# Patient Record
Sex: Female | Born: 1975 | Race: White | Hispanic: No | Marital: Married | Smoking: Never smoker
Health system: Southern US, Community
[De-identification: ages and names within clinical notes are randomized; demographics above are authoritative.]

---

## 2001-02-25 ENCOUNTER — Encounter: Admission: RE | Admit: 2001-02-25 | Discharge: 2001-02-25 | Payer: Self-pay | Admitting: Internal Medicine

## 2001-02-25 ENCOUNTER — Encounter: Payer: Self-pay | Admitting: Internal Medicine

## 2003-05-13 ENCOUNTER — Other Ambulatory Visit: Admission: RE | Admit: 2003-05-13 | Discharge: 2003-05-13 | Payer: Self-pay | Admitting: Family Medicine

## 2004-02-17 ENCOUNTER — Other Ambulatory Visit: Admission: RE | Admit: 2004-02-17 | Discharge: 2004-02-17 | Payer: Self-pay | Admitting: Obstetrics and Gynecology

## 2005-12-06 ENCOUNTER — Other Ambulatory Visit: Admission: RE | Admit: 2005-12-06 | Discharge: 2005-12-06 | Payer: Self-pay | Admitting: Family Medicine

## 2006-09-28 ENCOUNTER — Encounter: Admission: RE | Admit: 2006-09-28 | Discharge: 2006-09-28 | Payer: Self-pay | Admitting: Family Medicine

## 2006-12-26 ENCOUNTER — Other Ambulatory Visit: Admission: RE | Admit: 2006-12-26 | Discharge: 2006-12-26 | Payer: Self-pay | Admitting: Family Medicine

## 2007-12-28 ENCOUNTER — Other Ambulatory Visit: Admission: RE | Admit: 2007-12-28 | Discharge: 2007-12-28 | Payer: Self-pay | Admitting: Family Medicine

## 2009-06-10 ENCOUNTER — Encounter: Admission: RE | Admit: 2009-06-10 | Discharge: 2009-06-10 | Payer: Self-pay | Admitting: Orthopedic Surgery

## 2010-03-02 ENCOUNTER — Inpatient Hospital Stay (HOSPITAL_COMMUNITY): Admission: AD | Admit: 2010-03-02 | Discharge: 2010-03-04 | Payer: Self-pay | Admitting: Obstetrics and Gynecology

## 2010-03-02 ENCOUNTER — Inpatient Hospital Stay (HOSPITAL_COMMUNITY): Admission: AD | Admit: 2010-03-02 | Discharge: 2010-03-02 | Payer: Self-pay | Admitting: Obstetrics & Gynecology

## 2010-04-19 HISTORY — PX: OTHER SURGICAL HISTORY: SHX169

## 2010-06-30 LAB — CBC
HCT: 29.5 % — ABNORMAL LOW (ref 36.0–46.0)
HCT: 35.2 % — ABNORMAL LOW (ref 36.0–46.0)
Hemoglobin: 10.1 g/dL — ABNORMAL LOW (ref 12.0–15.0)
Hemoglobin: 11.9 g/dL — ABNORMAL LOW (ref 12.0–15.0)
MCH: 31.4 pg (ref 26.0–34.0)
MCH: 31.6 pg (ref 26.0–34.0)
MCHC: 33.8 g/dL (ref 30.0–36.0)
MCHC: 34 g/dL (ref 30.0–36.0)
MCV: 93 fL (ref 78.0–100.0)
Platelets: 252 10*3/uL (ref 150–400)
RBC: 3.79 MIL/uL — ABNORMAL LOW (ref 3.87–5.11)
RDW: 15.7 % — ABNORMAL HIGH (ref 11.5–15.5)
RDW: 15.7 % — ABNORMAL HIGH (ref 11.5–15.5)
WBC: 19.6 10*3/uL — ABNORMAL HIGH (ref 4.0–10.5)

## 2010-06-30 LAB — RPR: RPR Ser Ql: NONREACTIVE

## 2013-01-24 LAB — OB RESULTS CONSOLE ABO/RH: RH Type: POSITIVE

## 2013-01-24 LAB — OB RESULTS CONSOLE GC/CHLAMYDIA
CHLAMYDIA, DNA PROBE: NEGATIVE
GC PROBE AMP, GENITAL: NEGATIVE

## 2013-01-24 LAB — OB RESULTS CONSOLE HEPATITIS B SURFACE ANTIGEN: Hepatitis B Surface Ag: NEGATIVE

## 2013-01-24 LAB — OB RESULTS CONSOLE ANTIBODY SCREEN: ANTIBODY SCREEN: NEGATIVE

## 2013-01-24 LAB — OB RESULTS CONSOLE HIV ANTIBODY (ROUTINE TESTING): HIV: NONREACTIVE

## 2013-01-24 LAB — OB RESULTS CONSOLE RUBELLA ANTIBODY, IGM: RUBELLA: IMMUNE

## 2013-01-24 LAB — OB RESULTS CONSOLE RPR: RPR: NONREACTIVE

## 2013-04-19 NOTE — L&D Delivery Note (Signed)
Patient was C/C/+2 and pushed for <5  minutes with epidural.   NSVD  female infant, Apgars 9/9, weight pending.   The patient had a 1st degree laceration repaired with 3-0 vicryl. Fundus was firm. EBL was expected. Placenta was delivered intact. Vagina was clear.  Baby was vigorous and doing skin to skin with mother.  Kaitlin Horton

## 2013-08-01 LAB — OB RESULTS CONSOLE GBS
GBS: NEGATIVE
STREP GROUP B AG: NEGATIVE

## 2013-09-01 ENCOUNTER — Encounter (HOSPITAL_COMMUNITY): Payer: Self-pay | Admitting: *Deleted

## 2013-09-01 ENCOUNTER — Inpatient Hospital Stay (HOSPITAL_COMMUNITY): Payer: BC Managed Care – PPO | Admitting: Anesthesiology

## 2013-09-01 ENCOUNTER — Inpatient Hospital Stay (HOSPITAL_COMMUNITY)
Admission: AD | Admit: 2013-09-01 | Discharge: 2013-09-03 | DRG: 775 | Disposition: A | Discharge status: Home / Self Care | Payer: BC Managed Care – PPO | Source: Ambulatory Visit | Attending: Obstetrics and Gynecology | Admitting: Obstetrics and Gynecology

## 2013-09-01 ENCOUNTER — Encounter (HOSPITAL_COMMUNITY): Payer: BC Managed Care – PPO | Admitting: Anesthesiology

## 2013-09-01 DIAGNOSIS — O99214 Obesity complicating childbirth: Secondary | ICD-10-CM

## 2013-09-01 DIAGNOSIS — E669 Obesity, unspecified: Secondary | ICD-10-CM | POA: Diagnosis present

## 2013-09-01 DIAGNOSIS — O09529 Supervision of elderly multigravida, unspecified trimester: Secondary | ICD-10-CM | POA: Diagnosis present

## 2013-09-01 DIAGNOSIS — Z6841 Body Mass Index (BMI) 40.0 and over, adult: Secondary | ICD-10-CM

## 2013-09-01 DIAGNOSIS — O4290 Premature rupture of membranes, unspecified as to length of time between rupture and onset of labor, unspecified weeks of gestation: Secondary | ICD-10-CM | POA: Diagnosis present

## 2013-09-01 LAB — CBC
HEMATOCRIT: 33.9 % — AB (ref 36.0–46.0)
Hemoglobin: 11.3 g/dL — ABNORMAL LOW (ref 12.0–15.0)
MCH: 30.1 pg (ref 26.0–34.0)
MCHC: 33.3 g/dL (ref 30.0–36.0)
MCV: 90.4 fL (ref 78.0–100.0)
PLATELETS: 253 10*3/uL (ref 150–400)
RBC: 3.75 MIL/uL — ABNORMAL LOW (ref 3.87–5.11)
RDW: 14.9 % (ref 11.5–15.5)
WBC: 13 10*3/uL — AB (ref 4.0–10.5)

## 2013-09-01 LAB — TYPE AND SCREEN
ABO/RH(D): A POS
ANTIBODY SCREEN: NEGATIVE

## 2013-09-01 LAB — RPR

## 2013-09-01 LAB — ABO/RH: ABO/RH(D): A POS

## 2013-09-01 MED ORDER — LACTATED RINGERS IV SOLN: 500.0000 mL | Freq: Once | INTRAVENOUS | Status: DC

## 2013-09-01 MED ORDER — PHENYLEPHRINE 40 MCG/ML (10ML) SYRINGE FOR IV PUSH (FOR BLOOD PRESSURE SUPPORT)
80.0000 ug | PREFILLED_SYRINGE | INTRAVENOUS | Status: DC | PRN
  Filled 2013-09-01: qty 2
  Filled 2013-09-01: qty 10

## 2013-09-01 MED ORDER — PRENATAL MULTIVITAMIN CH
1.0000 | ORAL_TABLET | Freq: Every day | ORAL | Status: DC
  Administered 2013-09-02: 1 via ORAL
  Filled 2013-09-01: qty 1

## 2013-09-01 MED ORDER — SIMETHICONE 80 MG PO CHEW: 80.0000 mg | CHEWABLE_TABLET | ORAL | Status: DC | PRN

## 2013-09-01 MED ORDER — LIDOCAINE HCL (PF) 1 % IJ SOLN
30.0000 mL | INTRAMUSCULAR | Status: DC | PRN
  Filled 2013-09-01: qty 30

## 2013-09-01 MED ORDER — IBUPROFEN 600 MG PO TABS
600.0000 mg | ORAL_TABLET | Freq: Four times a day (QID) | ORAL | Status: DC
  Administered 2013-09-02 – 2013-09-03 (×6): 600 mg via ORAL
  Filled 2013-09-01 (×6): qty 1

## 2013-09-01 MED ORDER — LIDOCAINE HCL (PF) 1 % IJ SOLN
INTRAMUSCULAR | Status: DC | PRN
Start: 2013-09-01 — End: 2013-09-03
  Administered 2013-09-01 (×2): 5 mL

## 2013-09-01 MED ORDER — ZOLPIDEM TARTRATE 5 MG PO TABS: 5.0000 mg | ORAL_TABLET | Freq: Every evening | ORAL | Status: DC | PRN

## 2013-09-01 MED ORDER — PHENYLEPHRINE 40 MCG/ML (10ML) SYRINGE FOR IV PUSH (FOR BLOOD PRESSURE SUPPORT)
80.0000 ug | PREFILLED_SYRINGE | INTRAVENOUS | Status: DC | PRN
  Filled 2013-09-01: qty 2

## 2013-09-01 MED ORDER — FLEET ENEMA 7-19 GM/118ML RE ENEM: 1.0000 | ENEMA | RECTAL | Status: DC | PRN

## 2013-09-01 MED ORDER — ONDANSETRON HCL 4 MG/2ML IJ SOLN: 4.0000 mg | Freq: Four times a day (QID) | INTRAMUSCULAR | Status: DC | PRN

## 2013-09-01 MED ORDER — OXYTOCIN BOLUS FROM INFUSION: 500.0000 mL | INTRAVENOUS | Status: DC

## 2013-09-01 MED ORDER — WITCH HAZEL-GLYCERIN EX PADS: 1.0000 "application " | MEDICATED_PAD | CUTANEOUS | Status: DC | PRN

## 2013-09-01 MED ORDER — OXYTOCIN 40 UNITS IN LACTATED RINGERS INFUSION - SIMPLE MED
1.0000 m[IU]/min | INTRAVENOUS | Status: DC
  Administered 2013-09-01: 8 m[IU]/min via INTRAVENOUS
  Administered 2013-09-01: 6 m[IU]/min via INTRAVENOUS
  Administered 2013-09-01: 2 m[IU]/min via INTRAVENOUS
  Administered 2013-09-01: 4 m[IU]/min via INTRAVENOUS
  Filled 2013-09-01: qty 1000

## 2013-09-01 MED ORDER — DIBUCAINE 1 % RE OINT: 1.0000 "application " | TOPICAL_OINTMENT | RECTAL | Status: DC | PRN

## 2013-09-01 MED ORDER — OXYCODONE-ACETAMINOPHEN 5-325 MG PO TABS: 1.0000 | ORAL_TABLET | ORAL | Status: DC | PRN

## 2013-09-01 MED ORDER — DIPHENHYDRAMINE HCL 50 MG/ML IJ SOLN: 12.5000 mg | INTRAMUSCULAR | Status: DC | PRN

## 2013-09-01 MED ORDER — BENZOCAINE-MENTHOL 20-0.5 % EX AERO
1.0000 "application " | INHALATION_SPRAY | CUTANEOUS | Status: DC | PRN
  Filled 2013-09-01: qty 56

## 2013-09-01 MED ORDER — LACTATED RINGERS IV SOLN
INTRAVENOUS | Status: DC
  Administered 2013-09-01: 500 mL/h via INTRAVENOUS
  Administered 2013-09-01 (×2): via INTRAVENOUS

## 2013-09-01 MED ORDER — EPHEDRINE 5 MG/ML INJ
10.0000 mg | INTRAVENOUS | Status: DC | PRN
  Filled 2013-09-01: qty 4
  Filled 2013-09-01: qty 2

## 2013-09-01 MED ORDER — TETANUS-DIPHTH-ACELL PERTUSSIS 5-2.5-18.5 LF-MCG/0.5 IM SUSP
0.5000 mL | Freq: Once | INTRAMUSCULAR | Status: AC
  Administered 2013-09-02: 0.5 mL via INTRAMUSCULAR
  Filled 2013-09-01: qty 0.5

## 2013-09-01 MED ORDER — ACETAMINOPHEN 325 MG PO TABS: 650.0000 mg | ORAL_TABLET | ORAL | Status: DC | PRN

## 2013-09-01 MED ORDER — ONDANSETRON HCL 4 MG/2ML IJ SOLN: 4.0000 mg | INTRAMUSCULAR | Status: DC | PRN

## 2013-09-01 MED ORDER — DIPHENHYDRAMINE HCL 25 MG PO CAPS: 25.0000 mg | ORAL_CAPSULE | Freq: Four times a day (QID) | ORAL | Status: DC | PRN

## 2013-09-01 MED ORDER — OXYTOCIN 40 UNITS IN LACTATED RINGERS INFUSION - SIMPLE MED: 62.5000 mL/h | INTRAVENOUS | Status: DC

## 2013-09-01 MED ORDER — ONDANSETRON HCL 4 MG PO TABS: 4.0000 mg | ORAL_TABLET | ORAL | Status: DC | PRN

## 2013-09-01 MED ORDER — LACTATED RINGERS IV SOLN: 500.0000 mL | INTRAVENOUS | Status: DC | PRN

## 2013-09-01 MED ORDER — SENNOSIDES-DOCUSATE SODIUM 8.6-50 MG PO TABS
2.0000 | ORAL_TABLET | ORAL | Status: DC
  Administered 2013-09-02 (×2): 2 via ORAL
  Filled 2013-09-01 (×2): qty 2

## 2013-09-01 MED ORDER — EPHEDRINE 5 MG/ML INJ
10.0000 mg | INTRAVENOUS | Status: DC | PRN
  Filled 2013-09-01: qty 2

## 2013-09-01 MED ORDER — CITRIC ACID-SODIUM CITRATE 334-500 MG/5ML PO SOLN: 30.0000 mL | ORAL | Status: DC | PRN

## 2013-09-01 MED ORDER — TERBUTALINE SULFATE 1 MG/ML IJ SOLN: 0.2500 mg | Freq: Once | INTRAMUSCULAR | Status: DC | PRN

## 2013-09-01 MED ORDER — IBUPROFEN 600 MG PO TABS
600.0000 mg | ORAL_TABLET | Freq: Four times a day (QID) | ORAL | Status: DC | PRN
Start: 2013-09-01 — End: 2013-09-01

## 2013-09-01 MED ORDER — FENTANYL 2.5 MCG/ML BUPIVACAINE 1/10 % EPIDURAL INFUSION (WH - ANES)
14.0000 mL/h | INTRAMUSCULAR | Status: DC | PRN
  Administered 2013-09-01 (×2): 14 mL/h via EPIDURAL
  Filled 2013-09-01 (×2): qty 125

## 2013-09-01 MED ORDER — LANOLIN HYDROUS EX OINT: TOPICAL_OINTMENT | CUTANEOUS | Status: DC | PRN

## 2013-09-01 NOTE — Progress Notes (Signed)
[facility] Pt to BS ambulatory from Triage to 170

## 2013-09-01 NOTE — H&P (Signed)
38 y.o. 4241w1d  G2P1001 comes in c/o SROM.  Otherwise has good fetal movement and no bleeding.  History reviewed. No pertinent past medical history.  Past Surgical History  Procedure Laterality Date  . Pao Left 2012    l hip    OB History  Gravida Para Term Preterm AB SAB TAB Ectopic Multiple Living  2 1 1       1     # Outcome Date GA Lbr Len/2nd Weight Sex Delivery Anes PTL Lv  2 CUR           1 TRM 2011    F SVD EPI N Y      History   Social History  . Marital Status: Married    Spouse Name: N/A    Number of Children: N/A  . Years of Education: N/A   Occupational History  . Not on file.   Social History Main Topics  . Smoking status: Never Smoker   . Smokeless tobacco: Never Used  . Alcohol Use: No  . Drug Use: No  . Sexual Activity: Yes    Birth Control/ Protection: None   Other Topics Concern  . Not on file   Social History Narrative  . No narrative on file   Review of patient's allergies indicates no known allergies.    Prenatal Transfer Tool  Maternal Diabetes: No Genetic Screening: Declined Maternal Ultrasounds/Referrals: Normal Fetal Ultrasounds or other Referrals:  None Maternal Substance Abuse:  No Significant Maternal Medications:  None Significant Maternal Lab Results: Lab values include: Group B Strep negative  Other PNC: uncomplicated.    Filed Vitals:   09/01/13 0931  BP: 148/92  Pulse: 113  Temp:   Resp:      Lungs/Cor:  NAD Abdomen:  soft, gravid Ex:  no cords, erythema SVE:  0.5/50/-3 FHTs:  140, good STV, NST R Toco:  q3-4   A/P   Admit with SROM/PROM  GBS Neg  No change, irreg ctx, start pitocin 2x2  Epidural when desired  Elevated BPs, will check labs. Asymptomatic.  Had one BP elevated to 140/78 in office 5/6, normal at next visit.  Philip AspenSidney Zilda No

## 2013-09-01 NOTE — Anesthesia Preprocedure Evaluation (Signed)
Anesthesia Evaluation  Patient identified by MRN, date of birth, ID band Patient awake    Reviewed: Allergy & Precautions, H&P , Patient's Chart, lab work & pertinent test results  Airway Mallampati: III  TM Distance: >3 FB Neck ROM: full    Dental   Pulmonary  breath sounds clear to auscultation        Cardiovascular Rhythm:regular Rate:Normal     Neuro/Psych    GI/Hepatic   Endo/Other  Morbid obesity  Renal/GU      Musculoskeletal   Abdominal   Peds  Hematology   Anesthesia Other Findings   Reproductive/Obstetrics (+) Pregnancy                             Anesthesia Physical Anesthesia Plan  ASA: III  Anesthesia Plan: Epidural   Post-op Pain Management:    Induction:   Airway Management Planned:   Additional Equipment:   Intra-op Plan:   Post-operative Plan:   Informed Consent: I have reviewed the patients History and Physical, chart, labs and discussed the procedure including the risks, benefits and alternatives for the proposed anesthesia with the patient or authorized representative who has indicated his/her understanding and acceptance.     Plan Discussed with:   Anesthesia Plan Comments:         Anesthesia Quick Evaluation  

## 2013-09-01 NOTE — MAU Note (Signed)
Woke up about 0400 and had gush of fld. Went to BR and voided but i cont. To leak yellow fld. No contractions but occ "twinges"

## 2013-09-01 NOTE — Anesthesia Procedure Notes (Signed)
Epidural Patient location during procedure: OB Start time: 09/01/2013 1:16 PM  Staffing Anesthesiologist: Brayton CavesJACKSON, Kaitlin Horton Performed by: anesthesiologist   Preanesthetic Checklist Completed: patient identified, site marked, surgical consent, pre-op evaluation, timeout performed, IV checked, risks and benefits discussed and monitors and equipment checked  Epidural Patient position: sitting Prep: site prepped and draped and DuraPrep Patient monitoring: continuous pulse ox and blood pressure Approach: midline Location: L3-L4 Injection technique: LOR air  Needle:  Needle type: Tuohy  Needle gauge: 17 G Needle length: 9 cm and 9 Needle insertion depth: 8 cm Catheter type: closed end flexible Catheter size: 19 Gauge Catheter at skin depth: 14 cm Test dose: negative  Assessment Events: blood not aspirated, injection not painful, no injection resistance, negative IV test and no paresthesia  Additional Notes Patient identified.  Risk benefits discussed including failed block, incomplete pain control, headache, nerve damage, paralysis, blood pressure changes, nausea, vomiting, reactions to medication both toxic or allergic, and postpartum back pain.  Patient expressed understanding and wished to proceed.  All questions were answered.  Sterile technique used throughout procedure and epidural site dressed with sterile barrier dressing. No paresthesia or other complications noted.The patient did not experience any signs of intravascular injection such as tinnitus or metallic taste in mouth nor signs of intrathecal spread such as rapid motor block. Please see nursing notes for vital signs.

## 2013-09-02 LAB — CBC
HCT: 30.8 % — ABNORMAL LOW (ref 36.0–46.0)
Hemoglobin: 10.2 g/dL — ABNORMAL LOW (ref 12.0–15.0)
MCH: 30.1 pg (ref 26.0–34.0)
MCHC: 33.1 g/dL (ref 30.0–36.0)
MCV: 90.9 fL (ref 78.0–100.0)
PLATELETS: 242 10*3/uL (ref 150–400)
RBC: 3.39 MIL/uL — AB (ref 3.87–5.11)
RDW: 15 % (ref 11.5–15.5)
WBC: 15.4 10*3/uL — ABNORMAL HIGH (ref 4.0–10.5)

## 2013-09-02 NOTE — Progress Notes (Signed)
PPD#1 Patient is eating, ambulating, voiding.  Pain control is good.  Appropriate lochia.  No complaints.  Filed Vitals:   09/01/13 2122 09/02/13 0058 09/02/13 0557 09/02/13 0935  BP: 113/72 121/80 112/72 104/68  Pulse: 120 93 93 97  Temp: 99 F (37.2 C) 98.4 F (36.9 C) 97.8 F (36.6 C) 98.3 F (36.8 C)  TempSrc: Axillary  Oral Oral  Resp: 16 16 16 18   Height:      Weight:      SpO2:        Fundus firm Perineum without swelling. No CT  Lab Results  Component Value Date   WBC 15.4* 09/02/2013   HGB 10.2* 09/02/2013   HCT 30.8* 09/02/2013   MCV 90.9 09/02/2013   PLT 242 09/02/2013    --/--/A POS, A POS (05/16 [id number])  A/P Post partum day 1.  Routine care. PM delivery, pt prefers to stay add'l night. Expect d/c 5/18.   Circ done.   Philip AspenSidney Javaeh Muscatello

## 2013-09-02 NOTE — Lactation Note (Signed)
This note was copied from the chart of Kaitlin Horton. Lactation Consultation Note  Patient Name: Kaitlin Kamilah Correia DPTEL'M Date: 09/02/2013 Reason for consult: Initial assessment of this mom and baby, now 22 hours postpartum. Mom breastfed her older child  (now 54 1/36 yo) for 4 months until return to work.  She hopes to stay home longer with this new baby and LC observed baby latching well.  Mom has personal Medela electric pump at home but needs larger flanges so #30 flanges and kit provided.  Mom needs no assistance with latching baby.  Mom states that she knows how to hand express colostrum/milk.  LC encouraged cue feedings and STS and also informed mom of availability of suction test at [employer] store during daytime hours, if needed. LC encouraged review of Baby and Me pp 9, 14 and 20-25 for STS and BF information. LC provided [employer] brochure and reviewed [facility] services and list of community and web site resources.     Maternal Data Formula Feeding for Exclusion: No Infant to breast within first hour of birth: Yes (initial LATCH score=8) Has patient been taught Hand Expression?: Yes (Mom states that she knows how to use hand expression to express colostrum/milk) Does the patient have breastfeeding experience prior to this delivery?: Yes  Feeding Feeding Type: Breast Fed Length of feed: 20 min  LATCH Score/Interventions Latch: Grasps breast easily, tongue down, lips flanged, rhythmical sucking.  Audible Swallowing: Spontaneous and intermittent  Type of Nipple: Everted at rest and after stimulation  Comfort (Breast/Nipple): Soft / non-tender     Hold (Positioning): No assistance needed to correctly position infant at breast.  LATCH Score: 10 (LC observed baby latching easily with deep areolar grasp; mom has wide nipples and large/soft breasts, so LC reviewed various positions that may work best)  Lactation Tools Discussed/Used Tools: Pump;Flanges Flange Size: 30 (2 flanges  given) Breast pump type: Double-Electric Breast Pump (kit and larger flanges for home pump) Pump Review: Milk Storage Initiated by:: Junious Dresser, RN, IBCLC Date initiated:: 09/02/13 (kit and parts provided) STS, hand expression, cue feedings  Consult Status Consult Status: Follow-up Date: 09/03/13 Follow-up type: In-patient    Landis Gandy 09/02/2013, 5:48 PM

## 2013-09-02 NOTE — Anesthesia Postprocedure Evaluation (Signed)
Anesthesia Post Note  Patient: Kaitlin Horton  Procedure(s) Performed: * No procedures listed *  Anesthesia type: Epidural  Patient location: Mother/Baby  Post pain: Pain level controlled  Post assessment: Post-op Vital signs reviewed  Last Vitals:  Filed Vitals:   09/02/13 0935  BP: 104/68  Pulse: 97  Temp: 36.8 C  Resp: 18    Post vital signs: Reviewed  Level of consciousness:alert  Complications: No apparent anesthesia complications

## 2013-09-03 MED ORDER — IBUPROFEN 600 MG PO TABS: 600.0000 mg | ORAL_TABLET | Freq: Four times a day (QID) | ORAL | Status: AC | PRN

## 2013-09-03 MED ORDER — SENNOSIDES-DOCUSATE SODIUM 8.6-50 MG PO TABS: 2.0000 | ORAL_TABLET | Freq: Two times a day (BID) | ORAL | Status: AC

## 2013-09-03 MED ORDER — OXYCODONE-ACETAMINOPHEN 5-325 MG PO TABS: 1.0000 | ORAL_TABLET | ORAL | Status: AC | PRN

## 2013-09-03 NOTE — Discharge Instructions (Signed)

## 2013-09-03 NOTE — Discharge Summary (Signed)
Obstetric Discharge Summary Reason for Admission: onset of labor Prenatal Procedures: none Intrapartum Procedures: spontaneous vaginal delivery Postpartum Procedures: none Complications-Operative and Postpartum: none Hemoglobin  Date Value Ref Range Status  09/02/2013 10.2* 12.0 - 15.0 g/dL Final     HCT  Date Value Ref Range Status  09/02/2013 30.8* 36.0 - 46.0 % Final    Physical Exam:  General: alert, cooperative and no distress Lochia: appropriate Uterine Fundus: firm DVT Evaluation: No evidence of DVT seen on physical exam. Negative Homan's sign. No cords or calf tenderness.  Discharge Diagnoses: Term Pregnancy-delivered  Discharge Information: Date: 09/03/2013 Activity: pelvic rest Diet: routine Medications: Ibuprofen, Colace and Percocet Condition: stable Instructions: refer to practice specific booklet Discharge to: home   Newborn Data: Live born female  Birth Weight: 9 lb 11.6 oz (4410 g) APGAR: 8, 9  Home with mother.  Kaitlin Horton 09/03/2013, 8:59 AM

## 2013-09-03 NOTE — Progress Notes (Signed)
Post Partum Day 2 Subjective: no complaints, up ad lib, voiding, tolerating PO and + flatus She is both bottle and breast feeding  Objective: Blood pressure 139/90, pulse 99, temperature 98.4 F (36.9 C), temperature source Oral, resp. rate 18, height 6' (1.829 m), weight 146.512 kg (323 lb), SpO2 97.00%, unknown if currently breastfeeding.  Physical Exam:  General: alert, cooperative and no distress Lochia: appropriate Uterine Fundus: firm DVT Evaluation: No evidence of DVT seen on physical exam. Negative Homan's sign. No cords or calf tenderness.   Recent Labs  09/01/13 0635 09/02/13 0610  HGB 11.3* 10.2*  HCT 33.9* 30.8*    Assessment/Plan: Discharge home, Breastfeeding and Contraception will discuss at post partum visit   LOS: 2 days   [facility]da [name] 09/03/2013, 8:56 AM

## 2013-09-03 NOTE — Lactation Note (Signed)
This note was copied from the chart of [name]. Lactation Consultation Note: Mom had baby latched to breast when I went into room. Reports that baby has been nursing well. Reviewed BFSG and Op appointments as resources for support after DC. To call prn  Patient Name: [name] [id number]'XToday's Date: 09/03/2013 Reason for consult: Follow-up assessment   Maternal Data Formula Feeding for Exclusion: No  Feeding Feeding Type: Breast Fed Length of feed: 25 min  LATCH Score/Interventions Latch: Grasps breast easily, tongue down, lips flanged, rhythmical sucking.  Audible Swallowing: A few with stimulation  Type of Nipple: Everted at rest and after stimulation  Comfort (Breast/Nipple): Soft / non-tender     Hold (Positioning): No assistance needed to correctly position infant at breast.  LATCH Score: 9  Lactation Tools Discussed/Used     Consult Status Consult Status: Complete    Pamelia HoitDonna D Kataleena Holsapple 09/03/2013, 11:54 AM

## 2013-12-21 ENCOUNTER — Ambulatory Visit (HOSPITAL_COMMUNITY)
Admission: RE | Admit: 2013-12-21 | Discharge: 2013-12-21 | Disposition: A | Discharge status: Home / Self Care | Payer: BC Managed Care – PPO | Source: Ambulatory Visit | Attending: Obstetrics and Gynecology | Admitting: Obstetrics and Gynecology

## 2013-12-21 NOTE — Lactation Note (Addendum)
Lactation Consult  Mother's reason for visit:  Decreasing MS. Visit Type:  OP Appointment Notes:  Kaitlin Horton noticed that her MS began to drop mid to late July.  She started back to work 2 weeks ago and had to introduce formula this week.  She also has a job at night so she get to bed at 2 am.  She mentioned that Kaitlin Horton is starting to go to bed earlier so she should also be able to get to bed earlier.  Today I observed Kaitlin Horton.  Kaitlin Horton chews at the breast but was able to transfer 105 ml.  It had been about 7 hours since the last feeding or pumping.  I would have expected more transfer. Kaitlin Horton was rhythmic with his chewing but did not have long jaw excursions.  Infant's oral assessment: upper labial frenulum is insert near the alveolar ridge, Kaitlin Horton does not lateralize well.  There is a thick submucosal band of tissue under the posterior portion of the tongue.  I could not get him to suck on to my finger.  Kaitlin Horton vocalizes very well.  After BF mom post pumped for about 15 minutes and expressed about 10 ml.  She then did deep breast massage and hand expressed for a couple of minutes.  She then pumped again and her milk flowed easily.  She collected 60 ml.  Kaitlin Horton also ate again and had another 25 ml for a total transfer of 130 ml. This was encouraging to see.  Plan is to continue BF on cue and follow the above plan.  Follow-up next Friday.   Consult:  Initial Lactation Consultant:  Kaitlin Horton  ________________________________________________________________________  Kaitlin Horton Name: Kaitlin Horton  Date of Birth: 09/01/2011  Pediatrician: Kaitlin Horton  Gender: female  Gestational Age: [redacted]w[redacted]d (At Birth)  Birth Weight: 9 lb 11.6 oz (4410 g)  Weight at Discharge: Weight: 9 lb 3.3 oz (4175 g) Date of Discharge: 09/03/2013  Filed Weights   09/01/13 1751 09/03/13 0030  Weight: 9 lb 11.6 oz (4410 g) 9 lb 3.3 oz (4175 g)  [facility] Location:Pediatrician's office  Weight today:  7672  16 + 14.7   ________________________________________________________________________  Mother's Name: Kaitlin Horton Type of delivery:  vaginal Breastfeeding Experience:  1 st child for 5 mos but had to start supplementing at 4 mos Maternal Medical Conditions:   Maternal Medications:  Fenugreek,postnatal vitamin, metrogel for rosacea, fenugreek  ________________________________________________________________________  Breastfeeding History (Post Discharge)  Frequency of breastfeeding:  Baby is at the breast 4-5 times in the evening for extended times Duration of feeding:  25-45 minutes  Supplementation  Formula:  Volume 6-10 oz in 24 hours  Frequency:  twice       Brand: Similac  Breastmilk:  Volume 4- 6 oz  Frequency:  2-3 bottles   Method:  Bottle,   Pumping  Type of pump:  Medela pump in style Frequency:  4-5 times Volume:  2-4 ozml  Infant Intake and Output Assessment  Voids:  6+ in 24 hrs.  Color:  Clear yellow Stools:  2 large  in 24 hrs.  Color: light   Brown  ________________________________________________________________________  Maternal Breast Assessment  Breast:  Soft and Compressible Nipple:  Erect Pain level:  0 Pain interventions:    _______________________________________________________________________ Feeding Assessment/Evaluation  Initial feeding assessment:  Infant's oral assessment:  Variance upper labial frenulum is insert near the alveolar ridge, thick submucosal band of tissue under the posterior portion of the tongue.  Positioning:  Cross cradle Left breast  LATCH documentation:  Latch:  1  Chewing rhythmically no long jaw excursions.  Audible swallowing:  2 = Spontaneous and intermittent  Type of nipple:  2 = Everted at rest and after stimulation  Comfort (Breast/Nipple):  2 = Soft / non-tender  Hold (Positioning):  2 = No assistance needed to correctly position infant at breast  LATCH score:  9  Attached assessment:   Deep  Lips flanged:  Yes.  upper lip had to be flanged  Lips untucked:  No.  Suck assessment:  Displays both  Tools:  Flanges Instructed on use and cleaning of tool:  Yes.    Pre-feed weight:  7672 g   Post-feed weight:  7776 g  Amount transferred:  105 ml Amount supplemented:  0 ml  After mom pumped Kaitlin Horton went back to the breast and tranferred another 25 ml  No  Total amount pumped post feed:  R 2 ozl    L 2 ozl  Total amount transferred:  130 ml Total supplement given:  0 ml

## 2013-12-28 ENCOUNTER — Encounter (HOSPITAL_COMMUNITY): Payer: BC Managed Care – PPO

## 2014-02-18 ENCOUNTER — Encounter (HOSPITAL_COMMUNITY): Payer: Self-pay | Admitting: *Deleted

## 2014-04-09 ENCOUNTER — Other Ambulatory Visit: Payer: Self-pay | Admitting: Internal Medicine

## 2014-04-09 DIAGNOSIS — R599 Enlarged lymph nodes, unspecified: Secondary | ICD-10-CM

## 2014-04-15 ENCOUNTER — Ambulatory Visit
Admission: RE | Admit: 2014-04-15 | Discharge: 2014-04-15 | Disposition: A | Discharge status: Home / Self Care | Payer: BC Managed Care – PPO | Source: Ambulatory Visit | Attending: Internal Medicine | Admitting: Internal Medicine

## 2014-04-15 DIAGNOSIS — R599 Enlarged lymph nodes, unspecified: Secondary | ICD-10-CM

## 2014-08-22 ENCOUNTER — Other Ambulatory Visit: Payer: Self-pay | Admitting: Obstetrics and Gynecology

## 2014-08-22 DIAGNOSIS — R2231 Localized swelling, mass and lump, right upper limb: Secondary | ICD-10-CM

## 2014-09-24 ENCOUNTER — Ambulatory Visit
Admission: RE | Admit: 2014-09-24 | Discharge: 2014-09-24 | Disposition: A | Discharge status: Home / Self Care | Payer: BC Managed Care – PPO | Source: Ambulatory Visit | Attending: Obstetrics and Gynecology | Admitting: Obstetrics and Gynecology

## 2014-09-24 ENCOUNTER — Other Ambulatory Visit: Payer: Self-pay | Admitting: Obstetrics and Gynecology

## 2014-09-24 DIAGNOSIS — R2231 Localized swelling, mass and lump, right upper limb: Secondary | ICD-10-CM

## 2018-05-11 ENCOUNTER — Other Ambulatory Visit: Payer: Self-pay | Admitting: Obstetrics and Gynecology

## 2018-05-11 DIAGNOSIS — Z1231 Encounter for screening mammogram for malignant neoplasm of breast: Secondary | ICD-10-CM

## 2018-06-12 ENCOUNTER — Ambulatory Visit
Admission: RE | Admit: 2018-06-12 | Discharge: 2018-06-12 | Disposition: A | Discharge status: Home / Self Care | Payer: BC Managed Care – PPO | Source: Ambulatory Visit | Attending: Obstetrics and Gynecology | Admitting: Obstetrics and Gynecology

## 2018-06-12 DIAGNOSIS — Z1231 Encounter for screening mammogram for malignant neoplasm of breast: Secondary | ICD-10-CM

## 2019-06-02 ENCOUNTER — Ambulatory Visit: Payer: BC Managed Care – PPO

## 2019-06-14 ENCOUNTER — Ambulatory Visit: Payer: BC Managed Care – PPO | Attending: Family

## 2019-06-14 DIAGNOSIS — Z23 Encounter for immunization: Secondary | ICD-10-CM | POA: Insufficient documentation

## 2019-06-14 NOTE — Progress Notes (Signed)
   Covid-19 Vaccination Clinic  Name:  Kaitlin Horton    MRN: [id number] DOB: 1975-05-31  06/14/2019  Kaitlin Horton was observed post Covid-19 immunization for 15 minutes without incidence. She was provided with Vaccine Information Sheet and instruction to access the V-Safe system.   Kaitlin Horton was instructed to call 911 with any severe reactions post vaccine: Marland Kitchen Difficulty breathing  . Swelling of your face and throat  . A fast heartbeat  . A bad rash all over your body  . Dizziness and weakness    Immunizations Administered    Name Date Dose VIS Date Route   Moderna COVID-19 Vaccine 06/14/2019  3:05 PM 0.5 mL 03/20/2019 Intramuscular   Manufacturer: Moderna   Lot: [id number]   NDC: 19166-060-04

## 2019-06-28 IMAGING — MG DIGITAL SCREENING BILATERAL MAMMOGRAM WITH TOMO AND CAD
8 of 17 series · 8 of 40 positions shown · non-contrast
Comparison: Previous exam(s).

ACR Breast Density Category a: The breast tissue is almost entirely
fatty.

CLINICAL DATA: Screening.

EXAM:
DIGITAL SCREENING BILATERAL MAMMOGRAM WITH TOMO AND CAD

[L CC synth-2D (1 of 3)]
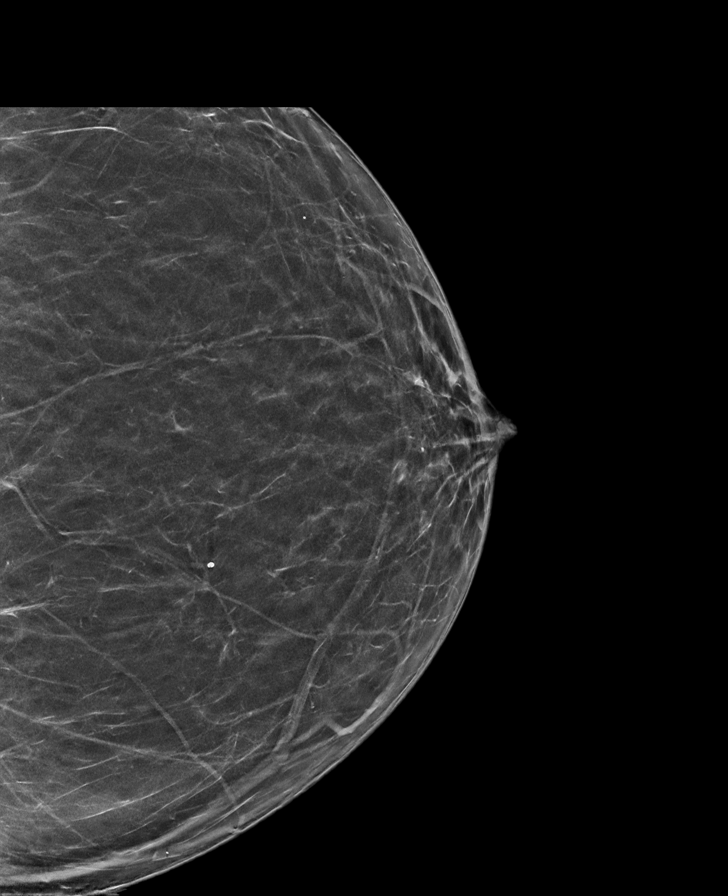

[R MLO synth-2D]
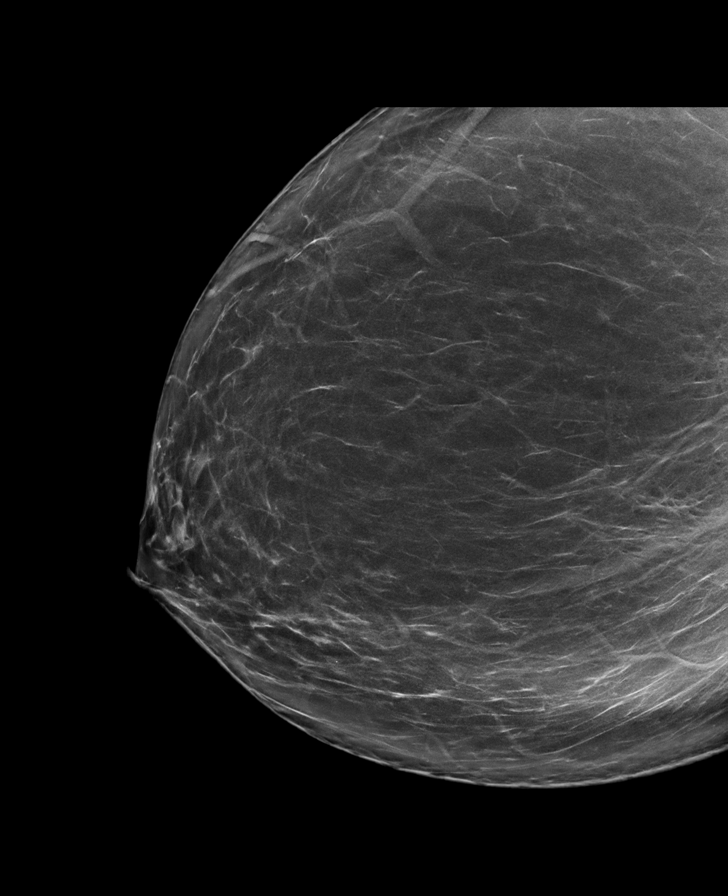

[L CC synth-2D (2 of 3)]
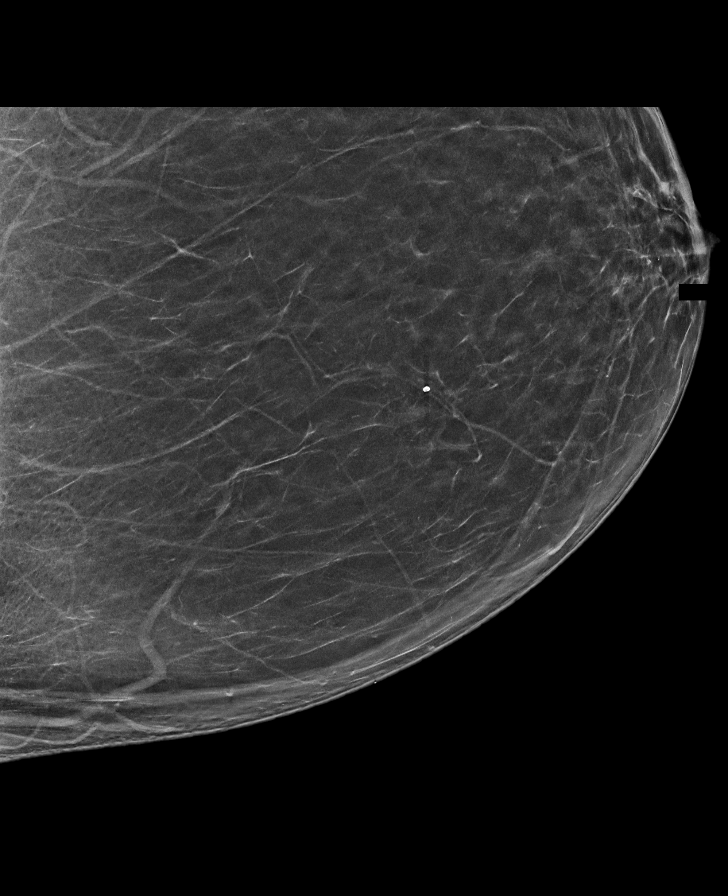

[L MLO synth-2D]
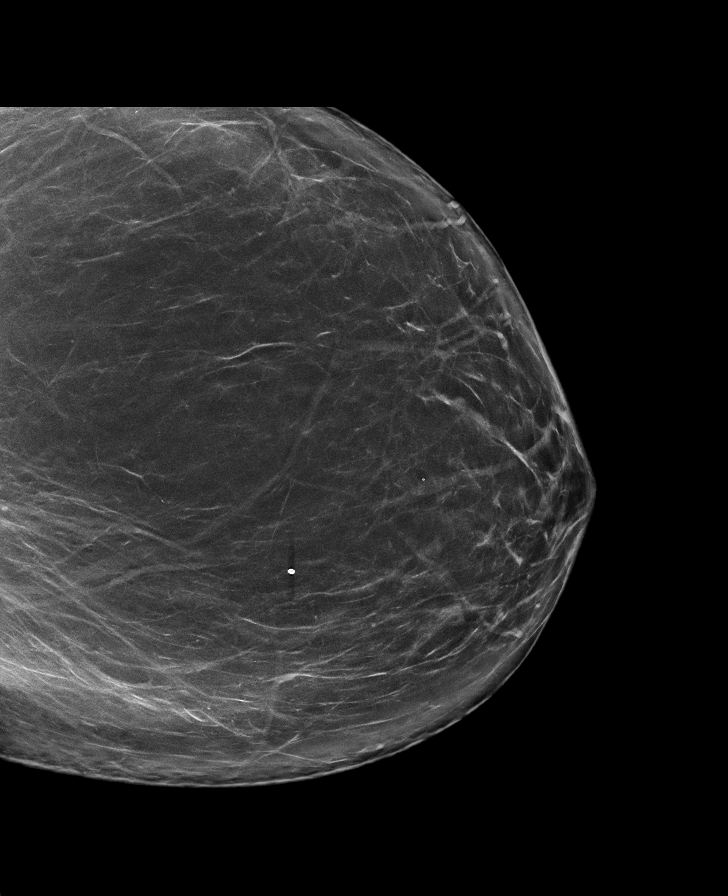

[R CC synth-2D (1 of 2)]
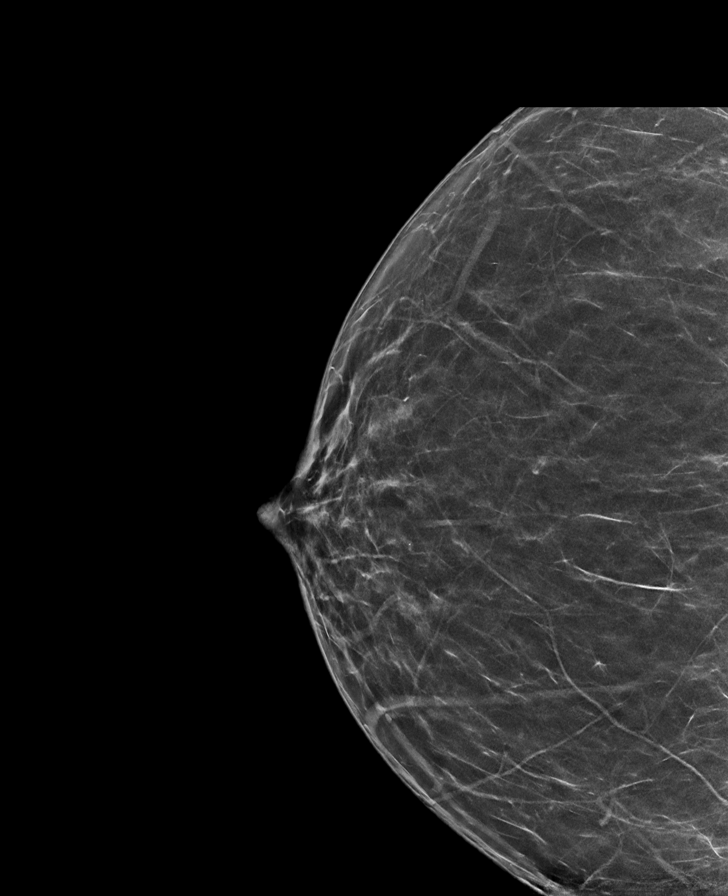

[R CC synth-2D (2 of 2)]
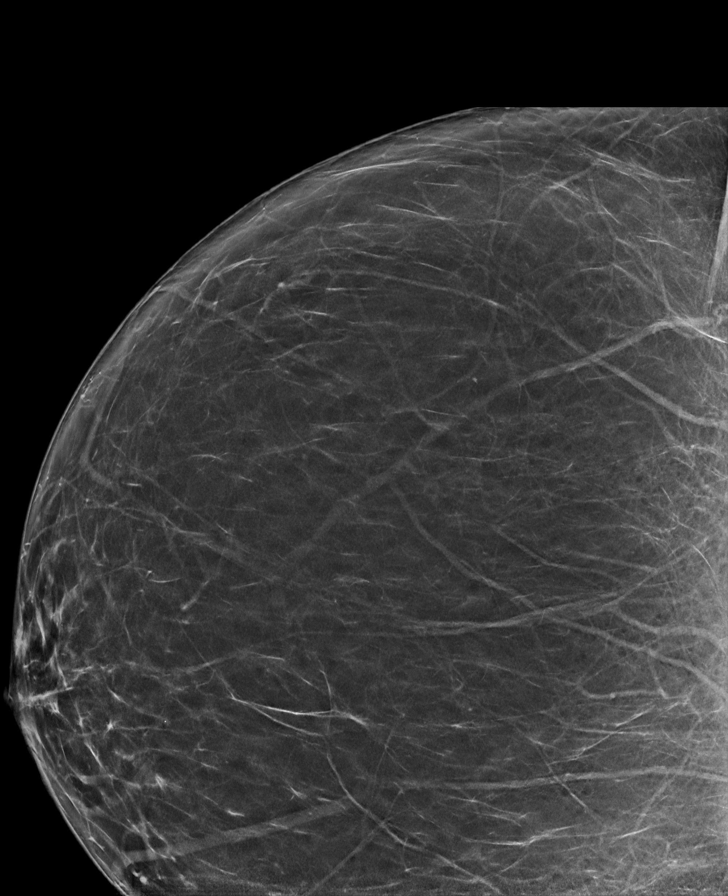

[L CC synth-2D (3 of 3)]
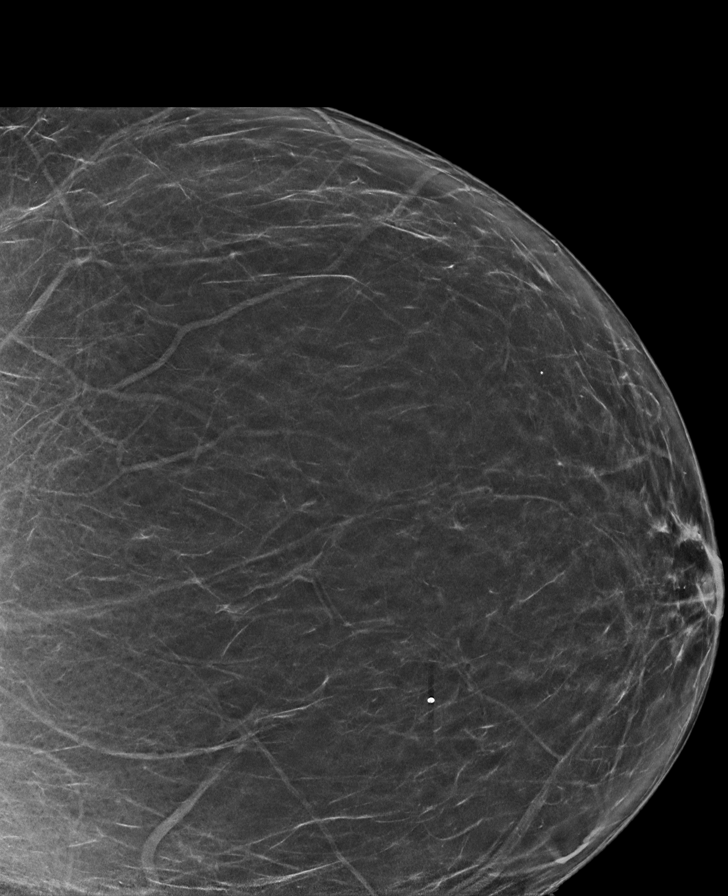

[R MLO tomo · tomo slice 49/97.0]
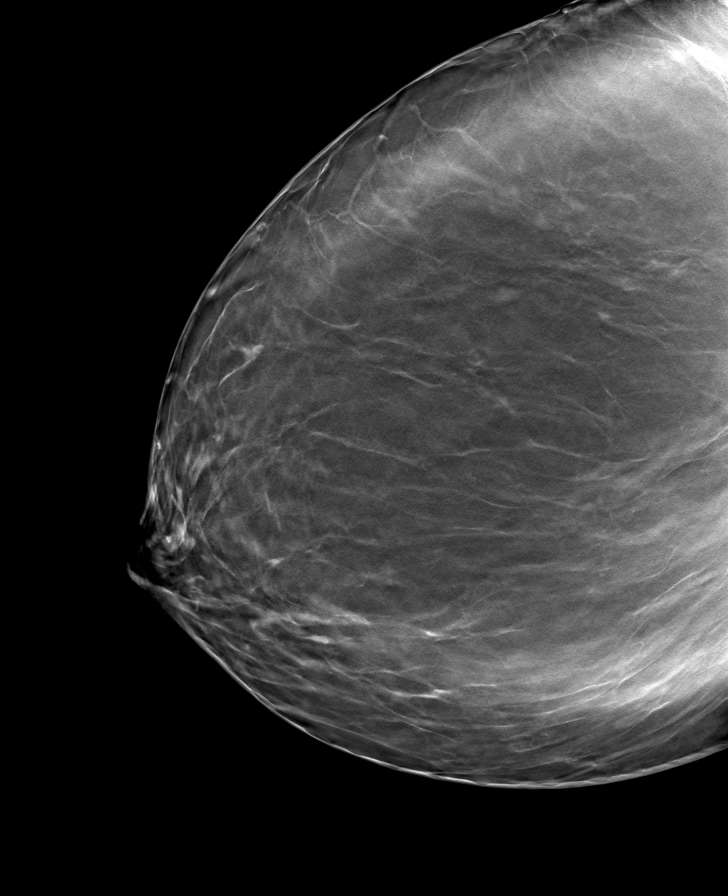

[8 of 40 positions shown; findings below may reference images not displayed]

FINDINGS: There are no findings suspicious for malignancy. Images were
processed with CAD.
IMPRESSION: No mammographic evidence of malignancy. A result letter of this
screening mammogram will be mailed directly to the patient.

RECOMMENDATION:
Screening mammogram in one year. (Code:[id number]-[id number])

BI-RADS CATEGORY  1: Negative.

## 2019-07-17 ENCOUNTER — Ambulatory Visit: Payer: BC Managed Care – PPO | Attending: Family

## 2019-07-17 DIAGNOSIS — Z23 Encounter for immunization: Secondary | ICD-10-CM

## 2019-07-17 NOTE — Progress Notes (Signed)
   Covid-19 Vaccination Clinic  Name:  Jilleen Essner    MRN: [id number] DOB: 07/13/1975  07/17/2019  Ms. Lashomb was observed post Covid-19 immunization for 15 minutes without incident. She was provided with Vaccine Information Sheet and instruction to access the V-Safe system.   Ms. Diener was instructed to call 911 with any severe reactions post vaccine: Marland Kitchen Difficulty breathing  . Swelling of face and throat  . A fast heartbeat  . A bad rash all over body  . Dizziness and weakness   Immunizations Administered    Name Date Dose VIS Date Route   Moderna COVID-19 Vaccine 07/17/2019  1:14 PM 0.5 mL 03/20/2019 Intramuscular   Manufacturer: Moderna   Lot: [id number]   NDC: 77939-688-64

## 2021-09-01 NOTE — Therapy (Signed)
OUTPATIENT PHYSICAL THERAPY LOWER EXTREMITY EVALUATION   Patient Name: Kaitlin Horton MRN: [id number] DOB:1975/11/15, 45 y.o., female Today's Date: 09/04/2021   PT End of Session - 09/04/21 1018     Visit Number 1    Number of Visits --   1-2x/week   Date for PT Re-Evaluation 10/30/21    Authorization Type BCBS    PT Start Time 0630    PT Stop Time 0700    PT Time Calculation (min) 30 min             No past medical history on file. Past Surgical History:  Procedure Laterality Date   PAO Left 2012   l hip   Patient Active Problem List   Diagnosis Date Noted   Normal labor and delivery 09/01/2013    PCP: Kristen Loader, FNP  REFERRING PROVIDER: Genia Del, MD  THERAPY DIAG:  Pain in left hip - Plan: PT plan of care cert/re-cert  Muscle weakness - Plan: PT plan of care cert/re-cert  Unsteadiness on feet - Plan: PT plan of care cert/re-cert  REFERRING DIAG: L hip  SUBJECTIVE:  PERTINENT PAST HISTORY:  L periacetabular osteotomy in 2012 (scheduled for hip replacement this summer) d/t history of hip dysplasia, R meniscus tear (chronic and partially healed)      PRECAUTIONS: None  WEIGHT BEARING RESTRICTIONS No  FALLS:  Has patient fallen in last 6 months? No, Number of falls: 0, does feel off balance  LIVING ENVIRONMENT: Lives with: lives with their family Stairs: Yes; External: 4 steps; on right going up  MOI/History of condition:  Onset date: Chronic > 1 year  Kaitlin Horton is a 46 y.o. female who presents to clinic with chief complaint of chronic L hip pain secondary to hip OA.  She had a PAO in 2012 d/t adult onset (symptoms) hip dysplasia which was helpful for a period but the pain has now returned.  She is currently scheduled for hip replcement this summer and would like to strengthen her hip before hand.  From referring provider:   "Miho Tune is a 46 y.o. female who presents today for follow-up of left hip pain.  The patient has a history of a PAO in 2012. She states that she has good days and bad days. The patient was last seen on 01/01/2021. She states that the injection helped significantly for a week and even her back pain has since calmed down. The patient states that her hip bothers her intermittently, mostly activity driven. 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 We had talked to her about her exercises at her last visit, but we did not give her any of physical therapy.  The patient states that the injection lasted for about a week. She states that the burning pain in her lower back has gone away for the most part. The patient states that her hip is achy today. She states that last night she worked for a while and detailed her car and for the rest of the evening her leg buckled a few times. The patient states that this morning it feels okay right now. She states that she still has trouble putting her sock on. The patient states that she would like to try physical therapy. She states that she has had muscle spasms on occasion, but not in the past several months. The patient states that her primary care physician  has given her a muscle relaxer, but she has not used them."   Red flags:  denies  Pain:  Are you having pain? 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  Assistive Device: NA  Hand Dominance: NA  Patient Goals/Specific Activities: walking for exercise, reduce pain   PLOF: Independent  DIAGNOSTIC  FINDINGS: X-ray shows "bone on bone"   OBJECTIVE:    GENERAL OBSERVATION/GAIT:   Antalgic gait, SPC R hand  SENSATION:  Light touch: Appears intact   MUSCLE LENGTH: Hamstrings: Right no restriction; Left minimal, w/ pain restriction ASLR: Right ASLR = PSLR; Left ASLR significantly reduced compared to PSLR  Ely's test: Right moderate restriction; Left moderate restriction  LE MMT:  MMT Right 09/04/2021 Left 09/04/2021  Hip flexion (L2, L3) 4+ 3+*  Knee extension (L3) 4+ 4+  Knee flexion 4+ 4+  Hip abduction 4 3 in available range  Hip extension 4+ 4  Hip external rotation N Unable to test d/t pain  Hip internal rotation N Unable to test d/t pain  Hip adduction    Ankle dorsiflexion (L4)    Ankle plantarflexion (S1)    Ankle inversion    Ankle eversion    Great Toe ext (L5)    Grossly     (Blank rows = not tested, score listed is out of 5 possible points.  N = WNL, D = diminished, C = clear for gross weakness with myotome testing, * = concordant pain with testing)  LE ROM:  ROM Right 09/04/2021 Left 09/04/2021  Hip flexion n 90*  Hip extension limited limited  Hip abduction    Hip adduction    Hip internal rotation limited WNL  Hip external rotation WNL Limited d/t pain  Knee flexion    Knee extension    Ankle dorsiflexion    Ankle plantarflexion    Ankle inversion    Ankle eversion     (Blank rows = not tested, N = WNL, * = concordant pain with testing)   LOWER EXTREMITY SPECIAL TESTS:    PATIENT SURVEYS:  FOTO 52 -> 66   TODAY'S TREATMENT: Creating, reviewing, and completing below HEP  Manual L hip LA distraction   PATIENT EDUCATION:  POC, diagnosis, prognosis, HEP, and outcome measures.  Pt educated via explanation, demonstration, and handout (HEP).  Pt confirms understanding verbally.   ASTERISK SIGNS   Asterisk Signs Eval (09/04/2021)                                                 HOME EXERCISE PROGRAM: Access Code:  [email or web address] URL: [email or web address] Date: 09/04/2021 Prepared by: Shearon Balo  Exercises - Supine Hip Adduction Isometric with Ball  - 1 x daily - 7 x weekly - 2 sets - 10 reps - 10'' hold - Hooklying Isometric Clamshell  - 1 x daily - 7 x weekly - 3 sets - 10 reps - Staggered Bridge  - 1 x daily - 7 x weekly -  3 sets - 10 reps - Active Straight Leg Raise with Quad Set  - 1 x daily - 7 x weekly - 3 sets - 10 reps  ASSESSMENT:  CLINICAL IMPRESSION: Kaitlin Horton is a 46 y.o. female who presents to clinic with signs and sxs consistent with L hip pain secondary to long standing pathology and degenerative changes.  She is here for prehab before a THA at the end of June.  She responded well to LAD  OBJECTIVE IMPAIRMENTS: Pain, hip ROM, hip strength, balance, gait  ACTIVITY LIMITATIONS: squatting, bending, standing, ambulation, pain at work  PERSONAL FACTORS: See medical history and pertinent history   REHAB POTENTIAL: Fair chronic, advanced OA  CLINICAL DECISION MAKING: Stable/uncomplicated  EVALUATION COMPLEXITY: Low   GOALS:   SHORT TERM GOALS: Target date: 09/25/2021  Kaitlin Horton will be >75% HEP compliant to improve carryover between sessions and facilitate independent management of condition  Evaluation (09/04/2021): ongoing Goal status: INITIAL   LONG TERM GOALS: Target date: 10/30/2021  Kaitlin Horton will improve FOTO score to 62 as a proxy for functional improvement  Evaluation/Baseline (09/04/2021): 52 Goal status: INITIAL   2.  Kaitlin Horton will self report >/= 50% decrease in pain from evaluation   Evaluation/Baseline (09/04/2021): 8/10 max pain Goal status: INITIAL   3.  Kaitlin Horton will improve the following MMTs to >/= 4/5 to show improvement in strength:  hip flexion and abd   Evaluation/Baseline (09/04/2021): see chart in note Goal status: INITIAL    PLAN: PT FREQUENCY: 1-2x/week  PT DURATION: 8 weeks (Ending 10/30/2021)  PLANNED INTERVENTIONS:  Therapeutic exercises, Aquatic therapy, Therapeutic activity, Neuro Muscular re-education, Gait training, Patient/Family education, Joint mobilization, Dry Needling, Electrical stimulation, Spinal mobilization and/or manipulation, Moist heat, Taping, Vasopneumatic device, Ionotophoresis 4mg /ml Dexamethasone, and Manual therapy  PLAN FOR NEXT SESSION: progressive: hip, core, balance; TDN and manual (distraction) PRN   Shearon Balo PT, DPT 09/04/2021, 10:36 AM

## 2021-09-03 ENCOUNTER — Ambulatory Visit: Payer: BC Managed Care – PPO | Attending: Radiology | Admitting: Physical Therapy

## 2021-09-03 DIAGNOSIS — R2681 Unsteadiness on feet: Secondary | ICD-10-CM | POA: Diagnosis present

## 2021-09-03 DIAGNOSIS — M25552 Pain in left hip: Secondary | ICD-10-CM | POA: Diagnosis present

## 2021-09-03 DIAGNOSIS — M6281 Muscle weakness (generalized): Secondary | ICD-10-CM | POA: Diagnosis present

## 2021-09-05 ENCOUNTER — Encounter: Payer: Self-pay | Admitting: Physical Therapy

## 2021-09-10 ENCOUNTER — Encounter: Payer: Self-pay | Admitting: Physical Therapy

## 2021-09-10 ENCOUNTER — Ambulatory Visit: Payer: BC Managed Care – PPO | Admitting: Physical Therapy

## 2021-09-10 DIAGNOSIS — M6281 Muscle weakness (generalized): Secondary | ICD-10-CM

## 2021-09-10 DIAGNOSIS — M25552 Pain in left hip: Secondary | ICD-10-CM

## 2021-09-10 DIAGNOSIS — R2681 Unsteadiness on feet: Secondary | ICD-10-CM

## 2021-09-10 NOTE — Therapy (Signed)
OUTPATIENT PHYSICAL THERAPY TREATMENT NOTE   Patient Name: Kaitlin Horton MRN: [id number] DOB:1976-01-20, 46 y.o., female Today's Date: 09/10/2021  PCP: Soundra Pilon, FNP REFERRING PROVIDER: Lacy Duverney, MD   PT End of Session - 09/10/21 1826     Visit Number 2    Number of Visits --   1-2x/week   Date for PT Re-Evaluation 10/30/21    Authorization Type BCBS    PT Start Time 1745    PT Stop Time 1825    PT Time Calculation (min) 40 min             History reviewed. No pertinent past medical history. Past Surgical History:  Procedure Laterality Date   PAO Left 2012   l hip   Patient Active Problem List   Diagnosis Date Noted   Normal labor and delivery 09/01/2013    THERAPY DIAG:  Pain in left hip  Muscle weakness  Unsteadiness on feet  REFERRING DIAG: L hip pain  PERTINENT HISTORY: L periacetabular osteotomy in 2012 (scheduled for hip replacement this summer) d/t history of hip dysplasia, R meniscus tear (chronic and partially healed)  PRECAUTIONS/RESTRICTIONS:   none  SUBJECTIVE:  Pt reports some compliance with HEP but has been busy and has been a little inconsistent.   Pain:  Are you having pain? Yes Pain location: "C" sign with posterior pain NPRS scale:  current 4-5/10  Aggravating factors: Standing (1 mile with cane), standing (30 min) Relieving factors: Rest Pain description: intermittent, sharp, and aching Stage: Chronic  OBJECTIVE:  GENERAL OBSERVATION/GAIT:                     Antalgic gait, SPC R hand   SENSATION:          Light touch: Appears intact     MUSCLE LENGTH: Hamstrings: Right no restriction; Left minimal, w/ pain restriction ASLR: Right ASLR = PSLR; Left ASLR significantly reduced compared to PSLR  Ely's test: Right moderate restriction; Left moderate restriction   LE MMT:   MMT Right 09/04/2021 Left 09/04/2021  Hip flexion (L2, L3) 4+ 3+*  Knee extension (L3) 4+ 4+  Knee flexion 4+ 4+  Hip  abduction 4 3 in available range  Hip extension 4+ 4  Hip external rotation N Unable to test d/t pain  Hip internal rotation N Unable to test d/t pain  Hip adduction      Ankle dorsiflexion (L4)      Ankle plantarflexion (S1)      Ankle inversion      Ankle eversion      Great Toe ext (L5)      Grossly        (Blank rows = not tested, score listed is out of 5 possible points.  N = WNL, D = diminished, C = clear for gross weakness with myotome testing, * = concordant pain with testing)   LE ROM:   ROM Right 09/04/2021 Left 09/04/2021  Hip flexion n 90*  Hip extension limited limited  Hip abduction      Hip adduction      Hip internal rotation limited WNL  Hip external rotation WNL Limited d/t pain  Knee flexion      Knee extension      Ankle dorsiflexion      Ankle plantarflexion      Ankle inversion      Ankle eversion        (Blank rows = not tested, N = WNL, * =  concordant pain with testing)     LOWER EXTREMITY SPECIAL TESTS:      PATIENT SURVEYS:  FOTO 52 -> 66     TODAY'S TREATMENT: Creating, reviewing, and completing below HEP   Manual L hip LA distraction     PATIENT EDUCATION:  POC, diagnosis, prognosis, HEP, and outcome measures.  Pt educated via explanation, demonstration, and handout (HEP).  Pt confirms understanding verbally.    ASTERISK SIGNS     Asterisk Signs Eval (09/04/2021)                                                                                           HOME EXERCISE PROGRAM: Access Code: [email or web address] URL: [email or web address] Date: 09/04/2021 Prepared by: Alphonzo SeveranceKarl Merced Brougham   Exercises - Supine Hip Adduction Isometric with Ball  - 1 x daily - 7 x weekly - 2 sets - 10 reps - 10'' hold - Hooklying Isometric Clamshell  - 1 x daily - 7 x weekly - 3 sets - 10 reps - Staggered Bridge  - 1 x daily - 7 x weekly - 3 sets - 10 reps - Active Straight Leg Raise with Quad Set  - 1 x daily - 7 x weekly - 3 sets - 10  reps   TREATMENT 5/25:  Therapeutic Exercise: - nu-step L5 6657m while taking subjective and planning session with patient - supine bridge - 2x10  - Staggered - 2x10  - S/L 3x5 - Pilates ring squeeze - 3x10 3'' - SLR - 3x10 - S/L clam GTB - S/L hip abd - 2x10  Manual Therapy: - LAD - lateral distraction   ASSESSMENT:   CLINICAL IMPRESSION: Kaitlin Horton did well with therapy.  She has some increase in pain with exercise, but this is expected to some degree.  She responds well to LAD with significant pain relief.    OBJECTIVE IMPAIRMENTS: Pain, hip ROM, hip strength, balance, gait   ACTIVITY LIMITATIONS: squatting, bending, standing, ambulation, pain at work   PERSONAL FACTORS: See medical history and pertinent history     REHAB POTENTIAL: Fair chronic, advanced OA   CLINICAL DECISION MAKING: Stable/uncomplicated   EVALUATION COMPLEXITY: Low     GOALS:     SHORT TERM GOALS: Target date: 09/25/2021   Kaitlin Horton will be >75% HEP compliant to improve carryover between sessions and facilitate independent management of condition   Evaluation (09/04/2021): ongoing Goal status: INITIAL     LONG TERM GOALS: Target date: 10/30/2021   Kaitlin Horton will improve FOTO score to 62 as a proxy for functional improvement   Evaluation/Baseline (09/04/2021): 52 Goal status: INITIAL     2.  Kaitlin Horton will self report >/= 50% decrease in pain from evaluation    Evaluation/Baseline (09/04/2021): 8/10 max pain Goal status: INITIAL     3.  Kaitlin Horton will improve the following MMTs to >/= 4/5 to show improvement in strength:  hip flexion and abd    Evaluation/Baseline (09/04/2021): see chart in note Goal status: INITIAL       PLAN: PT FREQUENCY: 1-2x/week   PT DURATION: 8 weeks (Ending 10/30/2021)   PLANNED INTERVENTIONS: Therapeutic exercises, Aquatic therapy, Therapeutic activity, Neuro  Muscular re-education, Gait training, Patient/Family education, Joint mobilization, Dry Needling,  Electrical stimulation, Spinal mobilization and/or manipulation, Moist heat, Taping, Vasopneumatic device, Ionotophoresis 4mg /ml Dexamethasone, and Manual therapy   PLAN FOR NEXT SESSION: progressive: hip, core, balance; TDN and manual (distraction) PRN   Zaron Zwiefelhofer PT 09/10/2021, 6:27 PM

## 2021-09-17 ENCOUNTER — Ambulatory Visit: Payer: BC Managed Care – PPO

## 2021-09-19 ENCOUNTER — Ambulatory Visit: Payer: BC Managed Care – PPO | Attending: Radiology

## 2021-09-19 DIAGNOSIS — R2681 Unsteadiness on feet: Secondary | ICD-10-CM | POA: Diagnosis present

## 2021-09-19 DIAGNOSIS — M25552 Pain in left hip: Secondary | ICD-10-CM | POA: Diagnosis present

## 2021-09-19 DIAGNOSIS — M6281 Muscle weakness (generalized): Secondary | ICD-10-CM | POA: Diagnosis present

## 2021-09-19 NOTE — Therapy (Signed)
OUTPATIENT PHYSICAL THERAPY TREATMENT NOTE   Patient Name: Kaitlin Horton MRN: [id number] DOB:06/27/1975, 46 y.o., female Today's Date: 09/19/2021  PCP: Kaitlin Horton REFERRING PROVIDER: Lacy Duverneylson, Steven, Horton   PT End of Session - 09/19/21 0818     Visit Number 3    Date for PT Re-Evaluation 10/30/21    Authorization Type BCBS    PT Start Time 0817    PT Stop Time 0857    PT Time Calculation (min) 40 min    Activity Tolerance Patient tolerated treatment well    Behavior During Therapy [facility] LLC[facility]FL for tasks assessed/performed              History reviewed. No pertinent past medical history. Past Surgical History:  Procedure Laterality Date   PAO Left 2012   l hip   Patient Active Problem List   Diagnosis Date Noted   Normal labor and delivery 09/01/2013    THERAPY DIAG:  Pain in left hip  Muscle weakness  Unsteadiness on feet  REFERRING DIAG: L hip pain  PERTINENT HISTORY: L periacetabular osteotomy in 2012 (scheduled for hip replacement this summer) d/t history of hip dysplasia, R meniscus tear (chronic and partially healed)  PRECAUTIONS/RESTRICTIONS:   none  SUBJECTIVE:  Pt reports being able to use her elliptical for 23 minutes last night without any residual pain. She reports continued low-level pain, although it is currently 0-1/10.  Pain:  Are you having pain? Yes Pain location: "C" sign with posterior pain NPRS scale:  current 0-1/10  Aggravating factors: Standing (1 mile with cane), standing (30 min) Relieving factors: Rest Pain description: intermittent, sharp, and aching Stage: Chronic  OBJECTIVE:  GENERAL OBSERVATION/GAIT:                     Antalgic gait, SPC R hand   SENSATION:          Light touch: Appears intact     MUSCLE LENGTH: Hamstrings: Right no restriction; Left minimal, w/ pain restriction ASLR: Right ASLR = PSLR; Left ASLR significantly reduced compared to PSLR  Ely's test: Right moderate restriction; Left  moderate restriction   LE MMT:   MMT Right 09/04/2021 Left 09/04/2021  Hip flexion (L2, L3) 4+ 3+*  Knee extension (L3) 4+ 4+  Knee flexion 4+ 4+  Hip abduction 4 3 in available range  Hip extension 4+ 4  Hip external rotation N Unable to test d/t pain  Hip internal rotation N Unable to test d/t pain  Hip adduction      Ankle dorsiflexion (L4)      Ankle plantarflexion (S1)      Ankle inversion      Ankle eversion      Great Toe ext (L5)      Grossly        (Blank rows = not tested, score listed is out of 5 possible points.  N = WNL, D = diminished, C = clear for gross weakness with myotome testing, * = concordant pain with testing)   LE ROM:   ROM Right 09/04/2021 Left 09/04/2021  Hip flexion n 90*  Hip extension limited limited  Hip abduction      Hip adduction      Hip internal rotation limited WNL  Hip external rotation WNL Limited d/t pain  Knee flexion      Knee extension      Ankle dorsiflexion      Ankle plantarflexion      Ankle inversion  Ankle eversion        (Blank rows = not tested, N = WNL, * = concordant pain with testing)     LOWER EXTREMITY SPECIAL TESTS:      PATIENT SURVEYS:  FOTO 52 -> 66     TODAY'S TREATMENT: Creating, reviewing, and completing below HEP   Manual L hip LA distraction     PATIENT EDUCATION:  Updated HEP (09/19/2021).  Pt educated via explanation, demonstration, and handout (HEP).  Pt confirms understanding verbally.    ASTERISK SIGNS     Asterisk Signs Eval (09/04/2021)                                                                                           HOME EXERCISE PROGRAM: Access Code: [email or web address] URL: [email or web address] Date: 09/04/2021 Prepared by: Kaitlin Horton   Exercises - Supine Hip Adduction Isometric with Ball  - 1 x daily - 7 x weekly - 2 sets - 10 reps - 10'' hold - Hooklying Isometric Clamshell  - 1 x daily - 7 x weekly - 3 sets - 10 reps - Staggered Bridge  - 1 x  daily - 7 x weekly - 3 sets - 10 reps - Active Straight Leg Raise with Quad Set  - 1 x daily - 7 x weekly - 3 sets - 10 reps  Added 09/19/2021: - Hip Flexor Stretch at Edge of Bed (Mirrored)  - 1 x daily - 7 x weekly - 2-min hold - Standing ITB Stretch  - 1 x daily - 7 x weekly - 2-min hold - Bridge with Hip Abduction and Resistance - Ground Touches  - 1 x daily - 7 x weekly - 3 sets - 10 reps  [facility] Adult PT Treatment:                                                DATE: 09/19/2021 Therapeutic Exercise: Bridge isometric with GTB marching 3x20 Lt Thomas stretch x13min Lt IT band stretch x68min 15# kettlebell dead lifts 3x8 Supine 90/90 BIL hip IR with GTB 3x15 LTR 2x10 BIL Manual Therapy: N/A Neuromuscular re-ed: N/A Therapeutic Activity: N/A Modalities: N/A Self Care: N/A    TREATMENT 5/25:  Therapeutic Exercise: - nu-step L5 42m while taking subjective and planning session with patient - supine bridge - 2x10  - Staggered - 2x10  - S/L 3x5 - Pilates ring squeeze - 3x10 3'' - SLR - 3x10 - S/L clam GTB - S/L hip abd - 2x10  Manual Therapy: - LAD - lateral distraction   ASSESSMENT:   CLINICAL IMPRESSION: Pt responded well to all interventions today, demonstrating good form and no increase in pain with performed exercises. Of note, the pt demonstrates tight Lt hip flexors and IT band upon assessment. These were addressed with stretches today. Additionally, the pt was able to perform functional weighted dead lifts with excellent form. She will continue to benefit from skilled PT to address her primary impairments and return to her prior level of  function with less limitation.   OBJECTIVE IMPAIRMENTS: Pain, hip ROM, hip strength, balance, gait   ACTIVITY LIMITATIONS: squatting, bending, standing, ambulation, pain at work   PERSONAL FACTORS: See medical history and pertinent history          GOALS:     SHORT TERM GOALS: Target date: 09/25/2021   Kaitlin Horton will be >75%  HEP compliant to improve carryover between sessions and facilitate independent management of condition   Evaluation (09/04/2021): ongoing Goal status: INITIAL     LONG TERM GOALS: Target date: 10/30/2021   Rhilyn will improve FOTO score to 62 as a proxy for functional improvement   Evaluation/Baseline (09/04/2021): 52 Goal status: INITIAL     2.  Catricia will self report >/= 50% decrease in pain from evaluation    Evaluation/Baseline (09/04/2021): 8/10 max pain Goal status: INITIAL     3.  Verlyn will improve the following MMTs to >/= 4/5 to show improvement in strength:  hip flexion and abd    Evaluation/Baseline (09/04/2021): see chart in note Goal status: INITIAL       PLAN: PT FREQUENCY: 1-2x/week   PT DURATION: 8 weeks (Ending 10/30/2021)   PLANNED INTERVENTIONS: Therapeutic exercises, Aquatic therapy, Therapeutic activity, Neuro Muscular re-education, Gait training, Patient/Family education, Joint mobilization, Dry Needling, Electrical stimulation, Spinal mobilization and/or manipulation, Moist heat, Taping, Vasopneumatic device, Ionotophoresis 4mg /ml Dexamethasone, and Manual therapy   PLAN FOR NEXT SESSION: progressive: hip, core, balance; TDN and manual (distraction) PRN   Stillman Buenger PT 09/19/2021, 8:59 AM

## 2021-09-24 ENCOUNTER — Ambulatory Visit: Payer: BC Managed Care – PPO | Admitting: Physical Therapy

## 2021-09-24 ENCOUNTER — Encounter: Payer: Self-pay | Admitting: Physical Therapy

## 2021-09-24 DIAGNOSIS — M25552 Pain in left hip: Secondary | ICD-10-CM | POA: Diagnosis not present

## 2021-09-24 DIAGNOSIS — M6281 Muscle weakness (generalized): Secondary | ICD-10-CM

## 2021-09-24 DIAGNOSIS — R2681 Unsteadiness on feet: Secondary | ICD-10-CM

## 2021-09-24 NOTE — Therapy (Signed)
OUTPATIENT PHYSICAL THERAPY TREATMENT NOTE   Patient Name: Kaitlin Horton MRN: [id number] DOB:Dec 30, 1975, 46 y.o., female Today's Date: 09/25/2021  PCP: Kristen Loader, FNP REFERRING PROVIDER: Genia Del, MD   PT End of Session - 09/24/21 1835     Visit Number 4    Date for PT Re-Evaluation 10/30/21    Authorization Type BCBS    PT Start Time [phone]    PT Stop Time 0712    PT Time Calculation (min) 38 min    Activity Tolerance Patient tolerated treatment well    Behavior During Therapy [facility] for tasks assessed/performed              History reviewed. No pertinent past medical history. Past Surgical History:  Procedure Laterality Date   PAO Left 2012   l hip   Patient Active Problem List   Diagnosis Date Noted   Normal labor and delivery 09/01/2013    THERAPY DIAG:  Pain in left hip  Muscle weakness  Unsteadiness on feet  REFERRING DIAG: L hip pain  PERTINENT HISTORY: L periacetabular osteotomy in 2012 (scheduled for hip replacement this summer) d/t history of hip dysplasia, R meniscus tear (chronic and partially healed)  PRECAUTIONS/RESTRICTIONS:   none  SUBJECTIVE:  Pt reports that she has been doing ok overall.  She She is having minimal pain today.  Pain:  Are you having pain? Yes Pain location: "C" sign with posterior pain NPRS scale:  current 2-3/10  Aggravating factors: Standing (1 mile with cane), standing (30 min) Relieving factors: Rest Pain description: intermittent, sharp, and aching Stage: Chronic  OBJECTIVE:  GENERAL OBSERVATION/GAIT:                     Antalgic gait, SPC R hand   SENSATION:          Light touch: Appears intact     MUSCLE LENGTH: Hamstrings: Right no restriction; Left minimal, w/ pain restriction ASLR: Right ASLR = PSLR; Left ASLR significantly reduced compared to PSLR  Ely's test: Right moderate restriction; Left moderate restriction   LE MMT:   MMT Right 09/04/2021 Left 09/04/2021  Hip  flexion (L2, L3) 4+ 3+*  Knee extension (L3) 4+ 4+  Knee flexion 4+ 4+  Hip abduction 4 3 in available range  Hip extension 4+ 4  Hip external rotation N Unable to test d/t pain  Hip internal rotation N Unable to test d/t pain  Hip adduction      Ankle dorsiflexion (L4)      Ankle plantarflexion (S1)      Ankle inversion      Ankle eversion      Great Toe ext (L5)      Grossly        (Blank rows = not tested, score listed is out of 5 possible points.  N = WNL, D = diminished, C = clear for gross weakness with myotome testing, * = concordant pain with testing)   LE ROM:   ROM Right 09/04/2021 Left 09/04/2021  Hip flexion n 90*  Hip extension limited limited  Hip abduction      Hip adduction      Hip internal rotation limited WNL  Hip external rotation WNL Limited d/t pain  Knee flexion      Knee extension      Ankle dorsiflexion      Ankle plantarflexion      Ankle inversion      Ankle eversion        (  Blank rows = not tested, N = WNL, * = concordant pain with testing)     LOWER EXTREMITY SPECIAL TESTS:      PATIENT SURVEYS:  FOTO 52 -> 66     TODAY'S TREATMENT: Creating, reviewing, and completing below HEP   Manual L hip LA distraction     PATIENT EDUCATION:  Updated HEP (09/19/2021).  Pt educated via explanation, demonstration, and handout (HEP).  Pt confirms understanding verbally.    ASTERISK SIGNS     Asterisk Signs Eval (09/04/2021)                                                                                           HOME EXERCISE PROGRAM: Access Code: [email or web address] URL: [email or web address] Date: 09/04/2021 Prepared by: Shearon Balo   Exercises - Supine Hip Adduction Isometric with Ball  - 1 x daily - 7 x weekly - 2 sets - 10 reps - 10'' hold - Hooklying Isometric Clamshell  - 1 x daily - 7 x weekly - 3 sets - 10 reps - Staggered Bridge  - 1 x daily - 7 x weekly - 3 sets - 10 reps - Active Straight Leg Raise with Quad Set   - 1 x daily - 7 x weekly - 3 sets - 10 reps  Added 09/19/2021: - Hip Flexor Stretch at Edge of Bed (Mirrored)  - 1 x daily - 7 x weekly - 2-min hold - Standing ITB Stretch  - 1 x daily - 7 x weekly - 2-min hold - Bridge with Hip Abduction and Resistance - Ground Touches  - 1 x daily - 7 x weekly - 3 sets - 10 reps  TREATMENT 6/8:  Therapeutic Exercise: - nu-step L5 72m while taking subjective and planning session with patient - supine bridge  - S/L 4x5 - Pilates ring squeeze - 3x10 3'' - SLR - 3x10 - 2# - S/L clam Blue TB - 3x15 - S/L hip abd - 3x10 - knee ext machine - (next visit) - hip ext machine - 37.5#  Manual Therapy: - LAD  [facility] Adult PT Treatment:                                                DATE: 09/19/2021 Therapeutic Exercise: Bridge isometric with GTB marching 3x20 Lt Thomas stretch x72min Lt IT band stretch x48min 15# kettlebell dead lifts 3x8 Supine 90/90 BIL hip IR with GTB 3x15 LTR 2x10 BIL Manual Therapy: N/A Neuromuscular re-ed: N/A Therapeutic Activity: N/A Modalities: N/A Self Care: N/A    TREATMENT 5/25:  Therapeutic Exercise: - nu-step L5 35m while taking subjective and planning session with patient - supine bridge - 2x10  - Staggered - 2x10  - S/L 3x5 - Pilates ring squeeze - 3x10 3'' - SLR - 3x10 - S/L clam GTB - S/L hip abd - 2x10  Manual Therapy: - LAD - lateral distraction   ASSESSMENT:   CLINICAL IMPRESSION: [name] did well with therapy.  She does have pain, as expected,  during exercise but this dissipates with rest breaks.  She is improving her strength and activity tolerance.  OBJECTIVE IMPAIRMENTS: Pain, hip ROM, hip strength, balance, gait   ACTIVITY LIMITATIONS: squatting, bending, standing, ambulation, pain at work   PERSONAL FACTORS: See medical history and pertinent history          GOALS:     SHORT TERM GOALS: Target date: 09/25/2021   Gentri will be >75% HEP compliant to improve carryover between sessions  and facilitate independent management of condition   Evaluation (09/04/2021): ongoing Goal status: INITIAL     LONG TERM GOALS: Target date: 10/30/2021   [name] will improve FOTO score to 62 as a proxy for functional improvement   Evaluation/Baseline (09/04/2021): 52 Goal status: INITIAL     2.  [name] will self report >/= 50% decrease in pain from evaluation    Evaluation/Baseline (09/04/2021): 8/10 max pain Goal status: INITIAL     3.  Alzena will improve the following MMTs to >/= 4/5 to show improvement in strength:  hip flexion and abd    Evaluation/Baseline (09/04/2021): see chart in note Goal status: INITIAL       PLAN: PT FREQUENCY: 1-2x/week   PT DURATION: 8 weeks (Ending 10/30/2021)   PLANNED INTERVENTIONS: Therapeutic exercises, Aquatic therapy, Therapeutic activity, Neuro Muscular re-education, Gait training, Patient/Family education, Joint mobilization, Dry Needling, Electrical stimulation, Spinal mobilization and/or manipulation, Moist heat, Taping, Vasopneumatic device, Ionotophoresis 4mg /ml Dexamethasone, and Manual therapy   PLAN FOR NEXT SESSION: progressive: hip, core, balance; TDN and manual (distraction) PRN   Kevan Ny Joana Nolton PT 09/25/2021, 9:11 AM

## 2021-10-01 ENCOUNTER — Encounter: Payer: Self-pay | Admitting: Physical Therapy

## 2021-10-01 ENCOUNTER — Ambulatory Visit: Payer: BC Managed Care – PPO | Admitting: Physical Therapy

## 2021-10-01 DIAGNOSIS — M25552 Pain in left hip: Secondary | ICD-10-CM

## 2021-10-01 DIAGNOSIS — M6281 Muscle weakness (generalized): Secondary | ICD-10-CM

## 2021-10-01 DIAGNOSIS — R2681 Unsteadiness on feet: Secondary | ICD-10-CM

## 2021-10-01 NOTE — Therapy (Signed)
OUTPATIENT PHYSICAL THERAPY TREATMENT NOTE   Patient Name: Kaitlin Horton MRN: [id number] DOB:1975/07/26, 46 y.o., female Today's Date: 10/02/2021  PCP: Soundra Pilon, FNP REFERRING PROVIDER: Lacy Duverney, MD   PT End of Session - 10/01/21 1835     Visit Number 5    Date for PT Re-Evaluation 10/30/21    Authorization Type BCBS    PT Start Time [phone]    PT Stop Time 0715    PT Time Calculation (min) 40 min    Activity Tolerance Patient tolerated treatment well    Behavior During Therapy [facility] for tasks assessed/performed              History reviewed. No pertinent past medical history. Past Surgical History:  Procedure Laterality Date   PAO Left 2012   l hip   Patient Active Problem List   Diagnosis Date Noted   Normal labor and delivery 09/01/2013    THERAPY DIAG:  Pain in left hip  Muscle weakness  Unsteadiness on feet  REFERRING DIAG: L hip pain  PERTINENT HISTORY: L periacetabular osteotomy in 2012 (scheduled for hip replacement this summer) d/t history of hip dysplasia, R meniscus tear (chronic and partially healed)  PRECAUTIONS/RESTRICTIONS:   none  SUBJECTIVE:  Pt reports that her hip is doing fairly well overall, but is still having pain.  Pain:  Are you having pain? Yes Pain location: "C" sign with posterior pain NPRS scale:  current 2-3/10  Aggravating factors: Standing (1 mile with cane), standing (30 min) Relieving factors: Rest Pain description: intermittent, sharp, and aching Stage: Chronic  OBJECTIVE:  GENERAL OBSERVATION/GAIT:                     Antalgic gait, SPC R hand   SENSATION:          Light touch: Appears intact     MUSCLE LENGTH: Hamstrings: Right no restriction; Left minimal, w/ pain restriction ASLR: Right ASLR = PSLR; Left ASLR significantly reduced compared to PSLR  Ely's test: Right moderate restriction; Left moderate restriction   LE MMT:   MMT Right 09/04/2021 Left 09/04/2021  Hip flexion  (L2, L3) 4+ 3+*  Knee extension (L3) 4+ 4+  Knee flexion 4+ 4+  Hip abduction 4 3 in available range  Hip extension 4+ 4  Hip external rotation N Unable to test d/t pain  Hip internal rotation N Unable to test d/t pain  Hip adduction      Ankle dorsiflexion (L4)      Ankle plantarflexion (S1)      Ankle inversion      Ankle eversion      Great Toe ext (L5)      Grossly        (Blank rows = not tested, score listed is out of 5 possible points.  N = WNL, D = diminished, C = clear for gross weakness with myotome testing, * = concordant pain with testing)   LE ROM:   ROM Right 09/04/2021 Left 09/04/2021  Hip flexion n 90*  Hip extension limited limited  Hip abduction      Hip adduction      Hip internal rotation limited WNL  Hip external rotation WNL Limited d/t pain  Knee flexion      Knee extension      Ankle dorsiflexion      Ankle plantarflexion      Ankle inversion      Ankle eversion        (  Blank rows = not tested, N = WNL, * = concordant pain with testing)     LOWER EXTREMITY SPECIAL TESTS:      PATIENT SURVEYS:  FOTO 52 -> 66     TODAY'S TREATMENT: Creating, reviewing, and completing below HEP   Manual L hip LA distraction     PATIENT EDUCATION:  Updated HEP (09/19/2021).  Pt educated via explanation, demonstration, and handout (HEP).  Pt confirms understanding verbally.    ASTERISK SIGNS     Asterisk Signs Eval (09/04/2021)                                                                                           HOME EXERCISE PROGRAM: Access Code: [email or web address] URL: [email or web address] Date: 09/04/2021 Prepared by: Alphonzo Severance   Exercises - Supine Hip Adduction Isometric with Ball  - 1 x daily - 7 x weekly - 2 sets - 10 reps - 10'' hold - Hooklying Isometric Clamshell  - 1 x daily - 7 x weekly - 3 sets - 10 reps - Staggered Bridge  - 1 x daily - 7 x weekly - 3 sets - 10 reps - Active Straight Leg Raise with Quad Set  - 1 x  daily - 7 x weekly - 3 sets - 10 reps  Added 09/19/2021: - Hip Flexor Stretch at Edge of Bed (Mirrored)  - 1 x daily - 7 x weekly - 2-min hold - Standing ITB Stretch  - 1 x daily - 7 x weekly - 2-min hold - Bridge with Hip Abduction and Resistance - Ground Touches  - 1 x daily - 7 x weekly - 3 sets - 10 reps   TREATMENT 6/15:  Therapeutic Exercise: - nu-step L5 75m with no UE support while taking subjective and planning session with patient - supine bridge  - S/L 4x5 - Pilates ring squeeze - 3x10 3'' - SLR - 3x12 - 2# - Clam Black TB - 3x15 - S/L hip abd - 3x12 - knee ext machine - 3x10 - 25# - hip ext machine - 37.5# - 3x10  Manual Therapy: - LAD  TREATMENT 6/8:  Therapeutic Exercise: - nu-step L5 50m while taking subjective and planning session with patient - supine bridge  - S/L 4x5 - Pilates ring squeeze - 3x10 3'' - SLR - 3x10 - 2# - S/L clam Blue TB - 3x15 - S/L hip abd - 3x10 - knee ext machine - (next visit) - hip ext machine - 37.5#  Manual Therapy: - LAD  [facility] Adult PT Treatment:                                                DATE: 09/19/2021 Therapeutic Exercise: Bridge isometric with GTB marching 3x20 Lt Thomas stretch x68min Lt IT band stretch x79min 15# kettlebell dead lifts 3x8 Supine 90/90 BIL hip IR with GTB 3x15 LTR 2x10 BIL Manual Therapy: N/A Neuromuscular re-ed: N/A Therapeutic Activity: N/A Modalities: N/A Self Care: N/A    TREATMENT 5/25:  Therapeutic Exercise: - nu-step L5 12m while taking subjective and planning session with patient - supine bridge - 2x10  - Staggered - 2x10  - S/L 3x5 - Pilates ring squeeze - 3x10 3'' - SLR - 3x10 - S/L clam GTB - S/L hip abd - 2x10  Manual Therapy: - LAD - lateral distraction   ASSESSMENT:   CLINICAL IMPRESSION: Kaitlin Horton continues to tolerate therapy well despite some hip pain with exercises.  She is making progress with load and feels she is able to complete ADLs with reduced  effort.  OBJECTIVE IMPAIRMENTS: Pain, hip ROM, hip strength, balance, gait   ACTIVITY LIMITATIONS: squatting, bending, standing, ambulation, pain at work   PERSONAL FACTORS: See medical history and pertinent history          GOALS:     SHORT TERM GOALS: Target date: 09/25/2021   Kaitlin Horton will be >75% HEP compliant to improve carryover between sessions and facilitate independent management of condition   Evaluation (09/04/2021): ongoing Goal status: INITIAL     LONG TERM GOALS: Target date: 10/30/2021   Kaitlin Horton will improve FOTO score to 62 as a proxy for functional improvement   Evaluation/Baseline (09/04/2021): 52 Goal status: INITIAL     2.  Ladye will self report >/= 50% decrease in pain from evaluation    Evaluation/Baseline (09/04/2021): 8/10 max pain Goal status: INITIAL     3.  Kaitlin Horton will improve the following MMTs to >/= 4/5 to show improvement in strength:  hip flexion and abd    Evaluation/Baseline (09/04/2021): see chart in note Goal status: INITIAL       PLAN: PT FREQUENCY: 1-2x/week   PT DURATION: 8 weeks (Ending 10/30/2021)   PLANNED INTERVENTIONS: Therapeutic exercises, Aquatic therapy, Therapeutic activity, Neuro Muscular re-education, Gait training, Patient/Family education, Joint mobilization, Dry Needling, Electrical stimulation, Spinal mobilization and/or manipulation, Moist heat, Taping, Vasopneumatic device, Ionotophoresis 4mg /ml Dexamethasone, and Manual therapy   PLAN FOR NEXT SESSION: progressive: hip, core, balance; TDN and manual (distraction) PRN   Jearline Hirschhorn PT 10/02/2021, 9:17 AM

## 2021-10-08 ENCOUNTER — Ambulatory Visit: Payer: BC Managed Care – PPO

## 2021-10-08 DIAGNOSIS — M25552 Pain in left hip: Secondary | ICD-10-CM

## 2021-10-08 DIAGNOSIS — R2681 Unsteadiness on feet: Secondary | ICD-10-CM

## 2021-10-08 DIAGNOSIS — M6281 Muscle weakness (generalized): Secondary | ICD-10-CM

## 2021-10-08 NOTE — Therapy (Signed)
OUTPATIENT PHYSICAL THERAPY TREATMENT NOTE/DISCHARGE  PHYSICAL THERAPY DISCHARGE SUMMARY  Visits from Start of Care: 6  Current functional level related to goals / functional outcomes: See goals and objective   Remaining deficits: See goals and objective   Education / Equipment: HEP and discharge   Patient agrees to discharge. Patient goals were not met. Patient is being discharged due to  upcoming L anterior THA surgery on 10/14/2021.   Patient Name: Kaitlin Horton MRN: [id number] DOB:06/01/1975, 46 y.o., female Today's Date: 10/08/2021  PCP: Kaitlin Loader, FNP REFERRING PROVIDER: Genia Del, MD   PT End of Session - 10/08/21 1748     Visit Number 6    Date for PT Re-Evaluation 10/30/21    Authorization Type BCBS    PT Start Time 1748    PT Stop Time 1820    PT Time Calculation (min) 32 min    Activity Tolerance Patient tolerated treatment well    Behavior During Therapy [facility] for tasks assessed/performed               History reviewed. No pertinent past medical history. Past Surgical History:  Procedure Laterality Date   PAO Left 2012   l hip   Patient Active Problem List   Diagnosis Date Noted   Normal labor and delivery 09/01/2013    THERAPY DIAG:  Pain in left hip  Muscle weakness  Unsteadiness on feet  REFERRING DIAG: L hip pain  PERTINENT HISTORY: L periacetabular osteotomy in 2012 (scheduled for hip replacement this summer) d/t history of hip dysplasia, R meniscus tear (chronic and partially healed)  PRECAUTIONS/RESTRICTIONS:  None  SUBJECTIVE:  Pt presents to PT with continued discomfort in L hip. She is having an anterior L THA on 10/14/2021. She is ready to discharge from current episode of PT at this time in anticipation of sugery.  Pain:  Are you having pain? Yes Pain location: "C" sign with posterior pain NPRS scale:  Current: 3/10  Aggravating factors: Standing (1 mile with cane), standing (30 min) Relieving  factors: Rest Pain description: intermittent, sharp, and aching Stage: Chronic  OBJECTIVE:  GENERAL OBSERVATION/GAIT:                     Antalgic gait, SPC R hand   SENSATION:          Light touch: Appears intact     MUSCLE LENGTH: Hamstrings: Right no restriction; Left minimal, w/ pain restriction ASLR: Right ASLR = PSLR; Left ASLR significantly reduced compared to PSLR  Ely's test: Right moderate restriction; Left moderate restriction   LE MMT:   MMT Right 09/04/2021 Left 09/04/2021  Hip flexion (L2, L3) 4+ 3+*  Knee extension (L3) 4+ 4+  Knee flexion 4+ 4+  Hip abduction 4 3 in available range  Hip extension 4+ 4  Hip external rotation N Unable to test d/t pain  Hip internal rotation N Unable to test d/t pain  Hip adduction      Ankle dorsiflexion (L4)      Ankle plantarflexion (S1)      Ankle inversion      Ankle eversion      Great Toe ext (L5)      Grossly        (Blank rows = not tested, score listed is out of 5 possible points.  N = WNL, D = diminished, C = clear for gross weakness with myotome testing, * = concordant pain with testing)   LE ROM:  ROM Right 09/04/2021 Left 09/04/2021  Hip flexion n 90*  Hip extension limited limited  Hip abduction      Hip adduction      Hip internal rotation limited WNL  Hip external rotation WNL Limited d/t pain  Knee flexion      Knee extension      Ankle dorsiflexion      Ankle plantarflexion      Ankle inversion      Ankle eversion        (Blank rows = not tested, N = WNL, * = concordant pain with testing)     PATIENT SURVEYS:  FOTO: 61% function - 10/08/2021    PATIENT EDUCATION:  Updated HEP (09/19/2021).  Pt educated via explanation, demonstration, and handout (HEP).  Pt confirms understanding verbally.    ASTERISK SIGNS     Asterisk Signs Eval (09/04/2021)                                                                                           HOME EXERCISE PROGRAM: Access Code:  [email or web address] URL: [email or web address] Date: 10/08/2021 Prepared by: Kaitlin Horton  Exercises - Supine Hip Adduction Isometric with Ball  - 1 x daily - 7 x weekly - 2 sets - 10 reps - 10'' hold - Hooklying Isometric Clamshell  - 1 x daily - 7 x weekly - 3 sets - 10 reps - Bridge with Hip Abduction and Resistance - Ground Touches  - 1 x daily - 7 x weekly - 3 sets - 10 reps - Active Straight Leg Raise with Quad Set  - 1 x daily - 7 x weekly - 3 sets - 10 reps - Standing ITB Stretch  - 1 x daily - 7 x weekly - 2-min hold    TREATMENT 6/22:  Therapeutic Exercise: Supine hip add x 10 - 5" hold Bridge with clamshell BTB x 10 Supine SLR x 10 each Therapeutic Activity: Assessment of tests/measures, goals and outcomes   ASSESSMENT:   CLINICAL IMPRESSION: Pt was able to complete prescribed exercises and demonstrated knowledge of HEP. She has progressed some over the course of PT, noting subjective improvement assessed via FOTO. Pt is having an anterior L THA on 10/14/2021 and is being discharged from current PT episode at this time.   OBJECTIVE IMPAIRMENTS: Pain, hip ROM, hip strength, balance, gait   ACTIVITY LIMITATIONS: squatting, bending, standing, ambulation, pain at work   PERSONAL FACTORS: See medical history and pertinent history     GOALS:     SHORT TERM GOALS: Target date: 09/25/2021   Kaitlin Horton will be >75% HEP compliant to improve carryover between sessions and facilitate independent management of condition   Evaluation (09/04/2021): ongoing Goal status: MET     LONG TERM GOALS: Target date: 10/30/2021   Kaitlin Horton will improve FOTO score to 62 as a proxy for functional improvement   Evaluation/Baseline (09/04/2021): 52 10/08/2021: 61% function Goal status: PARTIALLY MET     2.  Kaitlin Horton will self report >/= 50% decrease in pain from evaluation    Evaluation/Baseline (09/04/2021): 8/10 max pain Goal status: PARTIALLY MET     3.  Kaitlin Horton will improve  the following MMTs to >/= 4/5 to show improvement in strength:  hip flexion and abd    Evaluation/Baseline (09/04/2021): see chart in note Goal status: NOT MET       PLAN: PT FREQUENCY: 1-2x/week   PT DURATION: 8 weeks (Ending 10/30/2021)   PLANNED INTERVENTIONS: Therapeutic exercises, Aquatic therapy, Therapeutic activity, Neuro Muscular re-education, Gait training, Patient/Family education, Joint mobilization, Dry Needling, Electrical stimulation, Spinal mobilization and/or manipulation, Moist heat, Taping, Vasopneumatic device, Ionotophoresis 49m/ml Dexamethasone, and Manual therapy   PLAN FOR NEXT SESSION: progressive: hip, core, balance; TDN and manual (distraction) PRN   DWard ChattersPT 10/08/2021, 6:28 PM
